# Patient Record
Sex: Male | Born: 1945 | Race: White | Hispanic: No | Smoking: Never smoker
Health system: Southern US, Community
[De-identification: ages and names within clinical notes are randomized; demographics above are authoritative.]

## PROBLEM LIST (undated history)

## (undated) DIAGNOSIS — E785 Hyperlipidemia, unspecified: Secondary | ICD-10-CM

## (undated) DIAGNOSIS — I10 Essential (primary) hypertension: Secondary | ICD-10-CM

## (undated) DIAGNOSIS — N529 Male erectile dysfunction, unspecified: Secondary | ICD-10-CM

## (undated) DIAGNOSIS — I480 Paroxysmal atrial fibrillation: Secondary | ICD-10-CM

## (undated) DIAGNOSIS — N183 Chronic kidney disease, stage 3 unspecified: Secondary | ICD-10-CM

## (undated) DIAGNOSIS — I4821 Permanent atrial fibrillation: Secondary | ICD-10-CM

## (undated) DIAGNOSIS — E7800 Pure hypercholesterolemia, unspecified: Secondary | ICD-10-CM

## (undated) DIAGNOSIS — I499 Cardiac arrhythmia, unspecified: Secondary | ICD-10-CM

## (undated) DIAGNOSIS — L570 Actinic keratosis: Secondary | ICD-10-CM

## (undated) DIAGNOSIS — M1990 Unspecified osteoarthritis, unspecified site: Secondary | ICD-10-CM

## (undated) DIAGNOSIS — N189 Chronic kidney disease, unspecified: Secondary | ICD-10-CM

## (undated) HISTORY — PX: CARDIAC CATHETERIZATION: SHX172

## (undated) HISTORY — DX: Permanent atrial fibrillation: I48.21

## (undated) HISTORY — PX: KNEE ARTHROSCOPY: SUR90

## (undated) HISTORY — PX: MENISCUS REPAIR: SHX5179

## (undated) HISTORY — DX: Actinic keratosis: L57.0

## (undated) HISTORY — DX: Paroxysmal atrial fibrillation: I48.0

---

## 2004-11-11 ENCOUNTER — Ambulatory Visit: Payer: Self-pay | Admitting: Unknown Physician Specialty

## 2005-04-02 ENCOUNTER — Other Ambulatory Visit: Payer: Self-pay

## 2005-04-02 ENCOUNTER — Emergency Department: Payer: Self-pay | Admitting: Emergency Medicine

## 2005-05-20 ENCOUNTER — Ambulatory Visit: Payer: Self-pay | Admitting: Cardiology

## 2005-09-28 ENCOUNTER — Emergency Department: Payer: Self-pay | Admitting: Internal Medicine

## 2005-10-01 ENCOUNTER — Emergency Department: Payer: Self-pay | Admitting: Internal Medicine

## 2005-10-05 ENCOUNTER — Emergency Department: Payer: Self-pay | Admitting: Emergency Medicine

## 2005-10-12 ENCOUNTER — Emergency Department: Payer: Self-pay | Admitting: Unknown Physician Specialty

## 2005-10-26 ENCOUNTER — Emergency Department: Payer: Self-pay | Admitting: Emergency Medicine

## 2006-10-04 ENCOUNTER — Ambulatory Visit: Payer: Self-pay | Admitting: Cardiology

## 2007-10-09 ENCOUNTER — Ambulatory Visit: Payer: Self-pay | Admitting: Cardiology

## 2007-10-18 ENCOUNTER — Encounter: Payer: Self-pay | Admitting: Cardiology

## 2007-10-18 ENCOUNTER — Ambulatory Visit: Payer: Self-pay

## 2010-08-11 NOTE — Assessment & Plan Note (Signed)
[facility]                            CARDIOLOGY OFFICE NOTE   CORRION, STIREWALT                      MRN:          [id number]  DATE:10/04/2006                            DOB:          01-Nov-1945    Mr. Joseph Roberson returns today for further management of paroxysmal atrial  fibrillation.   He continues to see Dr. Fidela Juneau on a regular basis.   He still is remained extremely active.  He continues with aspirin 325 a  day.   He had 1 episode of atrial fib this past weekend.  He was hot and  exhausted and dehydrated.  He took diltiazem 60 mg p.r.n. as we  suggested.  This resolved within about 6 hours.   He feels well otherwise.   He did have a normal echocardiogram in 2006 with Dr. Randa Lynn.   His blood pressure today is 140/80, his pulse is 50 and regular.  His  weight is 244.  His EKG shows sinus brady, nonspecific changes, nothing  new since his last EKG.  HEENT:  Normocephalic, atraumatic.  PERRLA.  Extraocular movements  intact.  Sclerae clear.  Facial asymmetry is normal.  Carotids are full.  No JVD.  Thyroid is not enlarged.  LUNGS:  Clear.  HEART:  A slow rate and rhythm, no gallop.  ABDOMEN:  Protuberant.  Good bowel sounds.  EXTREMITIES:  No edema.  Pulses are intact.  NEURO:  Intact.   ASSESSMENT AND PLAN:  Mr. Dudenhoeffer is doing well.  I have asked him to  continue his current meds.  I renewed his diltiazem p.r.n. I will see  him back in a year.     Thomas C. Daleen Squibb, MD, [facility]  Electronically Signed    TCW/MedQ  DD: 10/04/2006  DT: 10/05/2006  Job #: [id number]   cc:   Mardelle Matte, M.D. Randa Lynn

## 2010-08-11 NOTE — Assessment & Plan Note (Signed)
[facility]                            CARDIOLOGY OFFICE NOTE   KHYLE, GOODELL                      MRN:          [id number]  DATE:10/09/2007                            DOB:          Mar 04, 1946    HISTORY OF PRESENT ILLNESS:  Mr. Joseph Roberson returns today for further  management of his lone atrial fibrillation.   He has had to use diltiazem 60 mg p.r.n. only 3 times since last year.   His blood pressure is up and down; it is always up in the office.  He  has not had a 2-D echocardiogram in some time to assess for possible  LVH.   MEDICATIONS:  His only medicine daily is aspirin 325 mg a day.   REVIEW OF SYSTEMS:  He denies orthopnea, PND, angina, or ischemic  symptoms.   PHYSICAL EXAMINATION:  VITAL SIGNS:  His blood pressure today is 153/87,  his weight has come down 7 pounds to 237, and his heart rate is 56 and  regular.  His electrocardiogram shows sinus brady, no changes.  HEENT:  Unchanged.  NECK:  Carotid upstrokes are equal bilaterally without bruits.  No JVD.  Thyroid is not enlarged.  Trachea is midline.  LUNGS:  Clear.  HEART:  Reveals a regular rate and rhythm.  Poorly appreciated PMI.  No  murmur.  ABDOMEN:  Protuberant with good bowel sounds.  EXTREMITIES:  No cyanosis, clubbing, or edema.  Pulses are intact.  NEURO:  Exam is intact.   ASSESSMENT AND PLAN:  I have had a nice chat with Joseph Roberson.  We have  renewed his diltiazem, and we will obtain a 2-D echocardiogram to assess  for left ventricular hypertrophy.  If It does not show left ventricular  hypertrophy, we will continue with his current treatment.  If it does,  he needs to be on antihypertensive medication.  We discussed this at  length.   Otherwise, I will see him back in a year.     Thomas C. Daleen Squibb, MD, [facility]  Electronically Signed    TCW/MedQ  DD: 10/09/2007  DT: 10/09/2007  Job #: [id number]   cc:   Reola Mosher. Randa Lynn, MD

## 2011-11-17 ENCOUNTER — Emergency Department: Payer: Self-pay

## 2012-07-06 ENCOUNTER — Emergency Department: Payer: Self-pay | Admitting: Internal Medicine

## 2012-07-15 ENCOUNTER — Emergency Department: Payer: Self-pay | Admitting: Internal Medicine

## 2013-08-02 DIAGNOSIS — M1711 Unilateral primary osteoarthritis, right knee: Secondary | ICD-10-CM | POA: Insufficient documentation

## 2013-10-23 DIAGNOSIS — M720 Palmar fascial fibromatosis [Dupuytren]: Secondary | ICD-10-CM | POA: Insufficient documentation

## 2013-10-23 DIAGNOSIS — S5400XA Injury of ulnar nerve at forearm level, unspecified arm, initial encounter: Secondary | ICD-10-CM | POA: Insufficient documentation

## 2014-04-02 DIAGNOSIS — D1621 Benign neoplasm of long bones of right lower limb: Secondary | ICD-10-CM | POA: Diagnosis not present

## 2014-04-12 DIAGNOSIS — M1711 Unilateral primary osteoarthritis, right knee: Secondary | ICD-10-CM | POA: Diagnosis not present

## 2014-04-12 DIAGNOSIS — S83241A Other tear of medial meniscus, current injury, right knee, initial encounter: Secondary | ICD-10-CM | POA: Diagnosis not present

## 2014-04-12 DIAGNOSIS — S83521A Sprain of posterior cruciate ligament of right knee, initial encounter: Secondary | ICD-10-CM | POA: Diagnosis not present

## 2014-04-15 DIAGNOSIS — E785 Hyperlipidemia, unspecified: Secondary | ICD-10-CM | POA: Diagnosis not present

## 2014-04-15 DIAGNOSIS — N183 Chronic kidney disease, stage 3 (moderate): Secondary | ICD-10-CM | POA: Diagnosis not present

## 2014-04-22 DIAGNOSIS — I480 Paroxysmal atrial fibrillation: Secondary | ICD-10-CM | POA: Diagnosis not present

## 2014-04-22 DIAGNOSIS — Z79899 Other long term (current) drug therapy: Secondary | ICD-10-CM | POA: Diagnosis not present

## 2014-04-22 DIAGNOSIS — E7800 Pure hypercholesterolemia, unspecified: Secondary | ICD-10-CM | POA: Insufficient documentation

## 2014-04-22 DIAGNOSIS — N529 Male erectile dysfunction, unspecified: Secondary | ICD-10-CM | POA: Diagnosis not present

## 2014-04-26 DIAGNOSIS — S83231A Complex tear of medial meniscus, current injury, right knee, initial encounter: Secondary | ICD-10-CM | POA: Diagnosis not present

## 2014-04-26 DIAGNOSIS — Y9364 Activity, baseball: Secondary | ICD-10-CM | POA: Diagnosis not present

## 2014-04-26 DIAGNOSIS — S83521A Sprain of posterior cruciate ligament of right knee, initial encounter: Secondary | ICD-10-CM | POA: Diagnosis not present

## 2014-04-26 DIAGNOSIS — M23321 Other meniscus derangements, posterior horn of medial meniscus, right knee: Secondary | ICD-10-CM | POA: Diagnosis not present

## 2014-04-26 DIAGNOSIS — M94261 Chondromalacia, right knee: Secondary | ICD-10-CM | POA: Diagnosis not present

## 2014-04-26 DIAGNOSIS — M179 Osteoarthritis of knee, unspecified: Secondary | ICD-10-CM | POA: Diagnosis not present

## 2014-04-26 DIAGNOSIS — Y92320 Baseball field as the place of occurrence of the external cause: Secondary | ICD-10-CM | POA: Diagnosis not present
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
## 2014-05-14 DIAGNOSIS — M25561 Pain in right knee: Secondary | ICD-10-CM | POA: Diagnosis not present

## 2014-05-23 DIAGNOSIS — M25561 Pain in right knee: Secondary | ICD-10-CM | POA: Diagnosis not present

## 2014-06-03 DIAGNOSIS — Z4789 Encounter for other orthopedic aftercare: Secondary | ICD-10-CM | POA: Diagnosis not present

## 2014-06-03 DIAGNOSIS — S82241D Displaced spiral fracture of shaft of right tibia, subsequent encounter for closed fracture with routine healing: Secondary | ICD-10-CM | POA: Diagnosis not present

## 2014-06-03 DIAGNOSIS — M1711 Unilateral primary osteoarthritis, right knee: Secondary | ICD-10-CM | POA: Diagnosis not present

## 2014-09-16 DIAGNOSIS — S8012XA Contusion of left lower leg, initial encounter: Secondary | ICD-10-CM | POA: Diagnosis not present

## 2014-10-21 DIAGNOSIS — N183 Chronic kidney disease, stage 3 (moderate): Secondary | ICD-10-CM | POA: Diagnosis not present

## 2014-10-21 DIAGNOSIS — E780 Pure hypercholesterolemia: Secondary | ICD-10-CM | POA: Diagnosis not present

## 2014-10-21 DIAGNOSIS — Z0000 Encounter for general adult medical examination without abnormal findings: Secondary | ICD-10-CM | POA: Diagnosis not present

## 2014-10-22 DIAGNOSIS — Z125 Encounter for screening for malignant neoplasm of prostate: Secondary | ICD-10-CM | POA: Diagnosis not present

## 2014-10-22 DIAGNOSIS — Z131 Encounter for screening for diabetes mellitus: Secondary | ICD-10-CM | POA: Diagnosis not present

## 2014-10-22 DIAGNOSIS — E780 Pure hypercholesterolemia: Secondary | ICD-10-CM | POA: Diagnosis not present

## 2015-01-06 DIAGNOSIS — Z23 Encounter for immunization: Secondary | ICD-10-CM | POA: Diagnosis not present

## 2015-01-06 DIAGNOSIS — J0100 Acute maxillary sinusitis, unspecified: Secondary | ICD-10-CM | POA: Diagnosis not present

## 2015-03-06 ENCOUNTER — Encounter: Payer: Self-pay | Admitting: *Deleted

## 2015-03-07 ENCOUNTER — Ambulatory Visit: Payer: Commercial Managed Care - HMO | Admitting: Anesthesiology

## 2015-03-07 ENCOUNTER — Encounter
Admission: RE | Disposition: A | Discharge status: Home / Self Care | Payer: Self-pay | Source: Ambulatory Visit | Attending: Unknown Physician Specialty

## 2015-03-07 ENCOUNTER — Ambulatory Visit
Admission: RE | Admit: 2015-03-07 | Discharge: 2015-03-07 | Disposition: A | Discharge status: Home / Self Care | Payer: Commercial Managed Care - HMO | Source: Ambulatory Visit | Attending: Unknown Physician Specialty | Admitting: Unknown Physician Specialty

## 2015-03-07 ENCOUNTER — Encounter: Payer: Self-pay | Admitting: *Deleted

## 2015-03-07 DIAGNOSIS — D122 Benign neoplasm of ascending colon: Secondary | ICD-10-CM | POA: Insufficient documentation

## 2015-03-07 DIAGNOSIS — N189 Chronic kidney disease, unspecified: Secondary | ICD-10-CM | POA: Insufficient documentation

## 2015-03-07 DIAGNOSIS — Z1211 Encounter for screening for malignant neoplasm of colon: Secondary | ICD-10-CM | POA: Diagnosis not present

## 2015-03-07 DIAGNOSIS — K5790 Diverticulosis of intestine, part unspecified, without perforation or abscess without bleeding: Secondary | ICD-10-CM | POA: Diagnosis not present

## 2015-03-07 DIAGNOSIS — Z7982 Long term (current) use of aspirin: Secondary | ICD-10-CM | POA: Diagnosis not present

## 2015-03-07 DIAGNOSIS — K635 Polyp of colon: Secondary | ICD-10-CM | POA: Diagnosis not present

## 2015-03-07 DIAGNOSIS — I499 Cardiac arrhythmia, unspecified: Secondary | ICD-10-CM | POA: Insufficient documentation

## 2015-03-07 DIAGNOSIS — K648 Other hemorrhoids: Secondary | ICD-10-CM | POA: Diagnosis not present

## 2015-03-07 DIAGNOSIS — E785 Hyperlipidemia, unspecified: Secondary | ICD-10-CM | POA: Insufficient documentation

## 2015-03-07 DIAGNOSIS — M1990 Unspecified osteoarthritis, unspecified site: Secondary | ICD-10-CM | POA: Insufficient documentation

## 2015-03-07 DIAGNOSIS — I129 Hypertensive chronic kidney disease with stage 1 through stage 4 chronic kidney disease, or unspecified chronic kidney disease: Secondary | ICD-10-CM | POA: Insufficient documentation

## 2015-03-07 DIAGNOSIS — K640 First degree hemorrhoids: Secondary | ICD-10-CM | POA: Insufficient documentation

## 2015-03-07 DIAGNOSIS — K5730 Diverticulosis of large intestine without perforation or abscess without bleeding: Secondary | ICD-10-CM | POA: Diagnosis not present

## 2015-03-07 HISTORY — DX: Essential (primary) hypertension: I10

## 2015-03-07 HISTORY — DX: Cardiac arrhythmia, unspecified: I49.9

## 2015-03-07 HISTORY — DX: Pure hypercholesterolemia, unspecified: E78.00

## 2015-03-07 HISTORY — DX: Chronic kidney disease, unspecified: N18.9

## 2015-03-07 HISTORY — PX: COLONOSCOPY WITH PROPOFOL: SHX5780

## 2015-03-07 HISTORY — DX: Unspecified osteoarthritis, unspecified site: M19.90

## 2015-03-07 HISTORY — DX: Male erectile dysfunction, unspecified: N52.9

## 2015-03-07 HISTORY — DX: Hyperlipidemia, unspecified: E78.5

## 2015-03-07 SURGERY — COLONOSCOPY WITH PROPOFOL
Anesthesia: General

## 2015-03-07 MED ORDER — PROPOFOL 10 MG/ML IV BOLUS
INTRAVENOUS | Status: DC | PRN
  Administered 2015-03-07: 50 mg via INTRAVENOUS

## 2015-03-07 MED ORDER — SODIUM CHLORIDE 0.9 % IV SOLN
INTRAVENOUS | Status: DC
  Administered 2015-03-07: 13:00:00 via INTRAVENOUS

## 2015-03-07 MED ORDER — FENTANYL CITRATE (PF) 100 MCG/2ML IJ SOLN
INTRAMUSCULAR | Status: DC | PRN
  Administered 2015-03-07: 50 ug via INTRAVENOUS

## 2015-03-07 MED ORDER — MIDAZOLAM HCL 5 MG/5ML IJ SOLN
INTRAMUSCULAR | Status: DC | PRN
  Administered 2015-03-07: 1 mg via INTRAVENOUS

## 2015-03-07 MED ORDER — PROPOFOL 500 MG/50ML IV EMUL
INTRAVENOUS | Status: DC | PRN
  Administered 2015-03-07: 140 ug/kg/min via INTRAVENOUS

## 2015-03-07 MED ORDER — SODIUM CHLORIDE 0.9 % IV SOLN: INTRAVENOUS | Status: DC

## 2015-03-07 NOTE — Anesthesia Preprocedure Evaluation (Signed)
Anesthesia Evaluation  Patient identified by MRN, date of birth, ID band Patient awake    Reviewed: Allergy & Precautions, H&P , NPO status , Patient's Chart, lab work & pertinent test results  History of Anesthesia Complications Negative for: history of anesthetic complications  Airway Mallampati: III  TM Distance: <3 FB Neck ROM: limited    Dental no notable dental hx. (+) Teeth Intact   Pulmonary neg pulmonary ROS, neg shortness of breath,    Pulmonary exam normal breath sounds clear to auscultation       Cardiovascular Exercise Tolerance: Good hypertension, (-) angina(-) Past MI and (-) DOE Normal cardiovascular exam+ dysrhythmias Atrial Fibrillation  Rhythm:regular Rate:Normal     Neuro/Psych negative neurological ROS  negative psych ROS   GI/Hepatic negative GI ROS, Neg liver ROS,   Endo/Other  negative endocrine ROS  Renal/GU Renal disease  negative genitourinary   Musculoskeletal  (+) Arthritis ,   Abdominal   Peds  Hematology negative hematology ROS (+)   Anesthesia Other Findings Past Medical History:   Chronic kidney disease                                       Hypercholesteremia                                           Erectile dysfunction                                         Arthritis                                                    Dysrhythmia                                                  Hypertension                                                 Hyperlipidemia                                              Past Surgical History:   KNEE ARTHROSCOPY                                              MENISCUS REPAIR  BMI    Body Mass Index   28.87 kg/m 2      Reproductive/Obstetrics negative OB ROS                             Anesthesia Physical Anesthesia Plan  ASA: III  Anesthesia Plan: General   Post-op Pain  Management:    Induction:   Airway Management Planned:   Additional Equipment:   Intra-op Plan:   Post-operative Plan:   Informed Consent: I have reviewed the patients History and Physical, chart, labs and discussed the procedure including the risks, benefits and alternatives for the proposed anesthesia with the patient or authorized representative who has indicated his/her understanding and acceptance.   Dental Advisory Given  Plan Discussed with: Anesthesiologist, CRNA and Surgeon  Anesthesia Plan Comments:         Anesthesia Quick Evaluation

## 2015-03-07 NOTE — Transfer of Care (Signed)
Immediate Anesthesia Transfer of Care Note  Patient: Joseph Roberson  Procedure(s) Performed: Procedure(s): COLONOSCOPY WITH PROPOFOL (N/A)  Patient Location: PACU and Endoscopy Unit  Anesthesia Type:General  Level of Consciousness: awake, alert  and oriented  Airway & Oxygen Therapy: Patient Spontanous Breathing and Patient connected to nasal cannula oxygen  Post-op Assessment: Report given to RN and Post -op Vital signs reviewed and stable  Post vital signs: Reviewed and stable  Last Vitals:  Filed Vitals:   03/07/15 1350 03/07/15 1354  BP: 140/90   Pulse: 84   Temp: 36.3 C 36.3 C  Resp: 18     Complications: No apparent anesthesia complications

## 2015-03-07 NOTE — Anesthesia Postprocedure Evaluation (Signed)
Anesthesia Post Note  Patient: Joseph Roberson  Procedure(s) Performed: Procedure(s) (LRB): COLONOSCOPY WITH PROPOFOL (N/A)  Patient location during evaluation: Endoscopy Anesthesia Type: General Level of consciousness: awake and alert Pain management: pain level controlled Vital Signs Assessment: post-procedure vital signs reviewed and stable Respiratory status: spontaneous breathing, nonlabored ventilation, respiratory function stable and patient connected to nasal cannula oxygen Cardiovascular status: blood pressure returned to baseline and stable Postop Assessment: no signs of nausea or vomiting Anesthetic complications: no    Last Vitals:  Filed Vitals:   03/07/15 1410 03/07/15 1420  BP: 149/104 136/102  Pulse: 66 122  Temp:    Resp: 18 15    Last Pain: There were no vitals filed for this visit.               Precious Haws Isom Kochan

## 2015-03-07 NOTE — H&P (Signed)
   Primary Care Physician:  Marcello Fennel, MD Primary Gastroenterologist:  Dr. Vira Agar  Pre-Procedure History & Physical: HPI:  Joseph Roberson is a 69 y.o. male is here for an colonoscopy.   Past Medical History  Diagnosis Date  . Chronic kidney disease   . Hypercholesteremia   . Erectile dysfunction   . Arthritis   . Dysrhythmia   . Hypertension   . Hyperlipidemia     Past Surgical History  Procedure Laterality Date  . Knee arthroscopy    . Meniscus repair      Prior to Admission medications   Medication Sig Start Date End Date Taking? Authorizing Provider  aspirin (ASPIRIN EC) 81 MG EC tablet Take 81 mg by mouth daily. Swallow whole.   Yes Historical Provider, MD    Allergies as of 12/23/2014  . (Not on File)    History reviewed. No pertinent family history.  Social History   Social History  . Marital Status: Divorced    Spouse Name: N/A  . Number of Children: N/A  . Years of Education: N/A   Occupational History  . Not on file.   Social History Main Topics  . Smoking status: Never Smoker   . Smokeless tobacco: Never Used  . Alcohol Use: 0.6 oz/week    1 Cans of beer per week  . Drug Use: No  . Sexual Activity: Not on file   Other Topics Concern  . Not on file   Social History Narrative    Review of Systems: See HPI, otherwise negative ROS  Physical Exam: BP 173/112 mmHg  Pulse 81  Temp(Src) 97.6 F (36.4 C) (Tympanic)  Resp 18  Ht 6\' 2"  (1.88 m)  Wt 102.059 kg (225 lb)  BMI 28.88 kg/m2  SpO2 98% General:   Alert,  pleasant and cooperative in NAD Head:  Normocephalic and atraumatic. Neck:  Supple; no masses or thyromegaly. Lungs:  Clear throughout to auscultation.    Heart:  Regular rate and rhythm. Abdomen:  Soft, nontender and nondistended. Normal bowel sounds, without guarding, and without rebound.   Neurologic:  Alert and  oriented x4;  grossly normal neurologically.  Impression/Plan: Joseph Roberson is here for an  colonoscopy to be performed for screening  Risks, benefits, limitations, and alternatives regarding  colonoscopy have been reviewed with the patient.  Questions have been answered.  All parties agreeable.   Gaylyn Cheers, MD  03/07/2015, 1:21 PM

## 2015-03-07 NOTE — Anesthesia Procedure Notes (Signed)
Date/Time: 03/07/2015 1:23 PM Performed by: Kennon Holter Pre-anesthesia Checklist: Timeout performed, Patient being monitored, Suction available, Emergency Drugs available and Patient identified Patient Re-evaluated:Patient Re-evaluated prior to inductionOxygen Delivery Method: Nasal cannula Preoxygenation: Pre-oxygenation with 100% oxygen Intubation Type: IV induction

## 2015-03-07 NOTE — Op Note (Signed)
[facility] Patient Name: Joseph Roberson Procedure Date: 03/07/2015 12:59 PM MRN: [id number] Account #: [id number] Date of Birth: Nov 27, 1945 Admit Type: Outpatient Age: 69 Room: [facility] ENDO ROOM 1 Gender: Male Note Status: Finalized Procedure:         Colonoscopy Indications:       Screening for colorectal malignant neoplasm Providers:         Manya Silvas, MD Referring MD:      Caprice Renshaw (Referring MD) Medicines:         Propofol per Anesthesia Complications:     No immediate complications. Procedure:         Pre-Anesthesia Assessment:                    - After reviewing the risks and benefits, the patient was                     deemed in satisfactory condition to undergo the procedure.                    After obtaining informed consent, the colonoscope was                     passed under direct vision. Throughout the procedure, the                     patient's blood pressure, pulse, and oxygen saturations                     were monitored continuously. The Colonoscope was                     introduced through the anus and advanced to the the cecum,                     identified by appendiceal orifice and ileocecal valve. The                     colonoscopy was performed without difficulty. The patient                     tolerated the procedure well. The quality of the bowel                     preparation was good. Findings:      A diminutive polyp was found in the ascending colon. The polyp was       sessile. The polyp was removed with a jumbo cold forceps. Resection and       retrieval were complete.      Multiple small-mouthed diverticula were found in the sigmoid colon, in       the descending colon and in the transverse colon.      Internal hemorrhoids were found during endoscopy. The hemorrhoids were       small and Grade I (internal hemorrhoids that do not prolapse).      The exam was otherwise without  abnormality. Impression:        - One diminutive polyp in the ascending colon. Resected                     and retrieved.                    - Diverticulosis in the sigmoid colon, in the descending  colon and in the transverse colon.                    - Internal hemorrhoids.                    - The examination was otherwise normal. Recommendation:    - Await pathology results. Manya Silvas, MD 03/07/2015 1:49:39 PM This report has been signed electronically. Number of Addenda: 0 Note Initiated On: 03/07/2015 12:59 PM Scope Withdrawal Time: 0 hours 12 minutes 50 seconds  Total Procedure Duration: 0 hours 21 minutes 55 seconds       [facility]

## 2015-03-08 ENCOUNTER — Encounter: Payer: Self-pay | Admitting: Unknown Physician Specialty

## 2015-03-11 LAB — SURGICAL PATHOLOGY

## 2015-04-14 DIAGNOSIS — N183 Chronic kidney disease, stage 3 (moderate): Secondary | ICD-10-CM | POA: Diagnosis not present

## 2015-04-14 DIAGNOSIS — E7800 Pure hypercholesterolemia, unspecified: Secondary | ICD-10-CM | POA: Diagnosis not present

## 2015-04-21 DIAGNOSIS — I480 Paroxysmal atrial fibrillation: Secondary | ICD-10-CM | POA: Diagnosis not present

## 2015-04-21 DIAGNOSIS — N183 Chronic kidney disease, stage 3 (moderate): Secondary | ICD-10-CM | POA: Diagnosis not present

## 2015-04-21 DIAGNOSIS — R0789 Other chest pain: Secondary | ICD-10-CM | POA: Diagnosis not present

## 2015-04-21 DIAGNOSIS — E7800 Pure hypercholesterolemia, unspecified: Secondary | ICD-10-CM | POA: Diagnosis not present

## 2015-04-21 DIAGNOSIS — I10 Essential (primary) hypertension: Secondary | ICD-10-CM | POA: Diagnosis not present

## 2015-04-22 DIAGNOSIS — R0789 Other chest pain: Secondary | ICD-10-CM | POA: Diagnosis not present

## 2015-05-01 DIAGNOSIS — I10 Essential (primary) hypertension: Secondary | ICD-10-CM | POA: Diagnosis not present

## 2015-05-01 DIAGNOSIS — Z125 Encounter for screening for malignant neoplasm of prostate: Secondary | ICD-10-CM | POA: Diagnosis not present

## 2015-05-01 DIAGNOSIS — Z79899 Other long term (current) drug therapy: Secondary | ICD-10-CM | POA: Diagnosis not present

## 2015-05-01 DIAGNOSIS — I4891 Unspecified atrial fibrillation: Secondary | ICD-10-CM | POA: Diagnosis not present

## 2015-05-15 DIAGNOSIS — H2510 Age-related nuclear cataract, unspecified eye: Secondary | ICD-10-CM | POA: Diagnosis not present

## 2015-05-15 DIAGNOSIS — I10 Essential (primary) hypertension: Secondary | ICD-10-CM | POA: Diagnosis not present

## 2015-05-15 DIAGNOSIS — H40039 Anatomical narrow angle, unspecified eye: Secondary | ICD-10-CM | POA: Diagnosis not present

## 2015-05-15 DIAGNOSIS — Z0100 Encounter for examination of eyes and vision without abnormal findings: Secondary | ICD-10-CM | POA: Diagnosis not present

## 2015-10-16 DIAGNOSIS — Z125 Encounter for screening for malignant neoplasm of prostate: Secondary | ICD-10-CM | POA: Diagnosis not present

## 2015-10-16 DIAGNOSIS — Z79899 Other long term (current) drug therapy: Secondary | ICD-10-CM | POA: Diagnosis not present

## 2015-10-16 DIAGNOSIS — I10 Essential (primary) hypertension: Secondary | ICD-10-CM | POA: Diagnosis not present

## 2015-10-23 DIAGNOSIS — Z79899 Other long term (current) drug therapy: Secondary | ICD-10-CM | POA: Diagnosis not present

## 2015-10-23 DIAGNOSIS — Z0000 Encounter for general adult medical examination without abnormal findings: Secondary | ICD-10-CM | POA: Diagnosis not present

## 2016-03-02 DIAGNOSIS — Z23 Encounter for immunization: Secondary | ICD-10-CM | POA: Diagnosis not present

## 2016-03-02 DIAGNOSIS — R1032 Left lower quadrant pain: Secondary | ICD-10-CM | POA: Diagnosis not present

## 2016-03-11 DIAGNOSIS — R1032 Left lower quadrant pain: Secondary | ICD-10-CM | POA: Diagnosis not present

## 2016-03-24 ENCOUNTER — Telehealth: Payer: Self-pay | Admitting: Family Medicine

## 2016-03-24 NOTE — Telephone Encounter (Signed)
Patient would like to get in earlier with Dr.Smith if he can.  States he is having mobility issues.

## 2016-03-25 DIAGNOSIS — R52 Pain, unspecified: Secondary | ICD-10-CM | POA: Diagnosis not present

## 2016-03-25 NOTE — Progress Notes (Deleted)
[facility] [address] [place], [place] [address] Phone: [phone] Subjective:    I'm seeing this patient by the request  of:  BABAOFF, MARC E, MD   CC: Left hip and groin pain  [id number]  Joseph Roberson is a 70 y.o. male coming in with complaint of left hip and groin pain.     Past Medical History:  Diagnosis Date  . Arthritis   . Chronic kidney disease   . Dysrhythmia   . Erectile dysfunction   . Hypercholesteremia   . Hyperlipidemia   . Hypertension    Past Surgical History:  Procedure Laterality Date  . COLONOSCOPY WITH PROPOFOL N/A 03/07/2015   Procedure: COLONOSCOPY WITH PROPOFOL;  Surgeon: Manya Silvas, MD;  Location: [facility] ENDOSCOPY;  Service: Endoscopy;  Laterality: N/A;  . KNEE ARTHROSCOPY    . MENISCUS REPAIR     Social History   Social History  . Marital status: Divorced    Spouse name: N/A  . Number of children: N/A  . Years of education: N/A   Social History Main Topics  . Smoking status: Never Smoker  . Smokeless tobacco: Never Used  . Alcohol use 0.6 oz/week    1 Cans of beer per week  . Drug use: No  . Sexual activity: Not on file   Other Topics Concern  . Not on file   Social History Narrative  . No narrative on file   Allergies  Allergen Reactions  . Cardizem [Diltiazem Hcl]   . Crestor [Rosuvastatin]   . Vytorin [Ezetimibe-Simvastatin]   . Zocor [Simvastatin]    No family history on file.  Past medical history, social, surgical and family history all reviewed in electronic medical record.  No pertanent information unless stated regarding to the chief complaint.   Review of Systems:Review of systems updated and as accurate as of 03/25/16  No headache, visual changes, nausea, vomiting, diarrhea, constipation, dizziness, abdominal pain, skin rash, fevers, chills, night sweats, weight loss, swollen lymph nodes, body aches, joint swelling, muscle aches, chest pain, shortness of breath, mood  changes.   Objective  There were no vitals taken for this visit. Systems examined below as of 03/25/16   General: No apparent distress alert and oriented x3 mood and affect normal, dressed appropriately.  HEENT: Pupils equal, extraocular movements intact  Respiratory: Patient's speak in full sentences and does not appear short of breath  Cardiovascular: No lower extremity edema, non tender, no erythema  Skin: Warm dry intact with no signs of infection or rash on extremities or on axial skeleton.  Abdomen: Soft nontender  Neuro: Cranial nerves II through XII are intact, neurovascularly intact in all extremities with 2+ DTRs and 2+ pulses.  Lymph: No lymphadenopathy of posterior or anterior cervical chain or axillae bilaterally.  Gait normal with good balance and coordination.  MSK:  Non tender with full range of motion and good stability and symmetric strength and tone of shoulders, elbows, wrist, knee and ankles bilaterally.  Hip: ROM IR: 45 Deg, ER: 45 Deg, Flexion: 120 Deg, Extension: 100 Deg, Abduction: 45 Deg, Adduction: 45 Deg Strength IR: 5/5, ER: 5/5, Flexion: 5/5, Extension: 5/5, Abduction: 5/5, Adduction: 5/5 Pelvic alignment unremarkable to inspection and palpation. Standing hip rotation and gait without trendelenburg sign / unsteadiness. Greater trochanter without tenderness to palpation. No tenderness over piriformis and greater trochanter. No pain with FABER or FADIR. No SI joint tenderness and normal minimal SI movement.   Impression and Recommendations:  This case required medical decision making of moderate complexity.      Note: This dictation was prepared with Dragon dictation along with smaller phrase technology. Any transcriptional errors that result from this process are unintentional.

## 2016-03-25 NOTE — Telephone Encounter (Signed)
Spoke to pt, he is coming in tomorrow @ 11am.

## 2016-03-26 ENCOUNTER — Ambulatory Visit: Payer: Commercial Managed Care - HMO | Admitting: Family Medicine

## 2016-03-26 DIAGNOSIS — M353 Polymyalgia rheumatica: Secondary | ICD-10-CM | POA: Diagnosis not present

## 2016-04-06 ENCOUNTER — Ambulatory Visit: Payer: Commercial Managed Care - HMO | Admitting: Family Medicine

## 2016-04-19 DIAGNOSIS — E7800 Pure hypercholesterolemia, unspecified: Secondary | ICD-10-CM | POA: Diagnosis not present

## 2016-04-19 DIAGNOSIS — Z79899 Other long term (current) drug therapy: Secondary | ICD-10-CM | POA: Diagnosis not present

## 2016-04-19 DIAGNOSIS — M353 Polymyalgia rheumatica: Secondary | ICD-10-CM | POA: Diagnosis not present

## 2016-04-19 DIAGNOSIS — Z87448 Personal history of other diseases of urinary system: Secondary | ICD-10-CM | POA: Diagnosis not present

## 2016-04-27 DIAGNOSIS — M353 Polymyalgia rheumatica: Secondary | ICD-10-CM | POA: Diagnosis not present

## 2016-04-27 DIAGNOSIS — I10 Essential (primary) hypertension: Secondary | ICD-10-CM | POA: Diagnosis not present

## 2016-04-27 DIAGNOSIS — Z79899 Other long term (current) drug therapy: Secondary | ICD-10-CM | POA: Diagnosis not present

## 2016-06-03 DIAGNOSIS — J0100 Acute maxillary sinusitis, unspecified: Secondary | ICD-10-CM | POA: Diagnosis not present

## 2016-07-05 DIAGNOSIS — Z79899 Other long term (current) drug therapy: Secondary | ICD-10-CM | POA: Diagnosis not present

## 2016-07-05 DIAGNOSIS — I10 Essential (primary) hypertension: Secondary | ICD-10-CM | POA: Diagnosis not present

## 2016-07-12 DIAGNOSIS — Z79899 Other long term (current) drug therapy: Secondary | ICD-10-CM | POA: Diagnosis not present

## 2016-07-12 DIAGNOSIS — M353 Polymyalgia rheumatica: Secondary | ICD-10-CM | POA: Diagnosis not present

## 2016-07-12 DIAGNOSIS — N183 Chronic kidney disease, stage 3 (moderate): Secondary | ICD-10-CM | POA: Diagnosis not present

## 2016-07-12 DIAGNOSIS — R739 Hyperglycemia, unspecified: Secondary | ICD-10-CM | POA: Diagnosis not present

## 2016-07-12 DIAGNOSIS — I10 Essential (primary) hypertension: Secondary | ICD-10-CM | POA: Diagnosis not present

## 2016-07-12 DIAGNOSIS — E7800 Pure hypercholesterolemia, unspecified: Secondary | ICD-10-CM | POA: Diagnosis not present

## 2016-07-12 DIAGNOSIS — I4891 Unspecified atrial fibrillation: Secondary | ICD-10-CM | POA: Diagnosis not present

## 2016-08-26 DIAGNOSIS — R69 Illness, unspecified: Secondary | ICD-10-CM | POA: Diagnosis not present

## 2016-11-08 DIAGNOSIS — N183 Chronic kidney disease, stage 3 (moderate): Secondary | ICD-10-CM | POA: Diagnosis not present

## 2016-11-08 DIAGNOSIS — Z79899 Other long term (current) drug therapy: Secondary | ICD-10-CM | POA: Diagnosis not present

## 2016-11-08 DIAGNOSIS — R739 Hyperglycemia, unspecified: Secondary | ICD-10-CM | POA: Diagnosis not present

## 2016-11-08 DIAGNOSIS — E7800 Pure hypercholesterolemia, unspecified: Secondary | ICD-10-CM | POA: Diagnosis not present

## 2016-11-08 DIAGNOSIS — M353 Polymyalgia rheumatica: Secondary | ICD-10-CM | POA: Diagnosis not present

## 2016-11-15 DIAGNOSIS — Z0000 Encounter for general adult medical examination without abnormal findings: Secondary | ICD-10-CM | POA: Diagnosis not present

## 2017-01-26 DIAGNOSIS — H40039 Anatomical narrow angle, unspecified eye: Secondary | ICD-10-CM | POA: Diagnosis not present

## 2017-01-26 DIAGNOSIS — Z0100 Encounter for examination of eyes and vision without abnormal findings: Secondary | ICD-10-CM | POA: Diagnosis not present

## 2017-05-12 DIAGNOSIS — N183 Chronic kidney disease, stage 3 (moderate): Secondary | ICD-10-CM | POA: Diagnosis not present

## 2017-05-12 DIAGNOSIS — E7800 Pure hypercholesterolemia, unspecified: Secondary | ICD-10-CM | POA: Diagnosis not present

## 2017-05-12 DIAGNOSIS — Z79899 Other long term (current) drug therapy: Secondary | ICD-10-CM | POA: Diagnosis not present

## 2017-05-12 DIAGNOSIS — Z125 Encounter for screening for malignant neoplasm of prostate: Secondary | ICD-10-CM | POA: Diagnosis not present

## 2017-05-12 DIAGNOSIS — R739 Hyperglycemia, unspecified: Secondary | ICD-10-CM | POA: Diagnosis not present

## 2017-05-19 DIAGNOSIS — M353 Polymyalgia rheumatica: Secondary | ICD-10-CM | POA: Diagnosis not present

## 2017-05-19 DIAGNOSIS — R739 Hyperglycemia, unspecified: Secondary | ICD-10-CM | POA: Diagnosis not present

## 2017-05-19 DIAGNOSIS — Z79899 Other long term (current) drug therapy: Secondary | ICD-10-CM | POA: Diagnosis not present

## 2017-05-19 DIAGNOSIS — I4891 Unspecified atrial fibrillation: Secondary | ICD-10-CM | POA: Diagnosis not present

## 2017-05-19 DIAGNOSIS — E7800 Pure hypercholesterolemia, unspecified: Secondary | ICD-10-CM | POA: Diagnosis not present

## 2017-05-19 DIAGNOSIS — I10 Essential (primary) hypertension: Secondary | ICD-10-CM | POA: Diagnosis not present

## 2017-06-16 DIAGNOSIS — M353 Polymyalgia rheumatica: Secondary | ICD-10-CM | POA: Diagnosis not present

## 2017-06-16 DIAGNOSIS — M1990 Unspecified osteoarthritis, unspecified site: Secondary | ICD-10-CM | POA: Diagnosis not present

## 2017-07-04 DIAGNOSIS — M1990 Unspecified osteoarthritis, unspecified site: Secondary | ICD-10-CM | POA: Diagnosis not present

## 2017-07-04 DIAGNOSIS — M353 Polymyalgia rheumatica: Secondary | ICD-10-CM | POA: Diagnosis not present

## 2017-07-12 DIAGNOSIS — M353 Polymyalgia rheumatica: Secondary | ICD-10-CM | POA: Diagnosis not present

## 2017-08-02 DIAGNOSIS — S8011XA Contusion of right lower leg, initial encounter: Secondary | ICD-10-CM | POA: Diagnosis not present

## 2017-08-11 DIAGNOSIS — M353 Polymyalgia rheumatica: Secondary | ICD-10-CM | POA: Diagnosis not present

## 2017-09-19 DIAGNOSIS — M353 Polymyalgia rheumatica: Secondary | ICD-10-CM | POA: Diagnosis not present

## 2017-10-11 DIAGNOSIS — M722 Plantar fascial fibromatosis: Secondary | ICD-10-CM | POA: Diagnosis not present

## 2017-10-18 DIAGNOSIS — M79672 Pain in left foot: Secondary | ICD-10-CM | POA: Diagnosis not present

## 2017-10-20 DIAGNOSIS — R739 Hyperglycemia, unspecified: Secondary | ICD-10-CM | POA: Diagnosis not present

## 2017-10-20 DIAGNOSIS — Z79899 Other long term (current) drug therapy: Secondary | ICD-10-CM | POA: Diagnosis not present

## 2017-10-20 DIAGNOSIS — M353 Polymyalgia rheumatica: Secondary | ICD-10-CM | POA: Diagnosis not present

## 2017-10-20 DIAGNOSIS — E7800 Pure hypercholesterolemia, unspecified: Secondary | ICD-10-CM | POA: Diagnosis not present

## 2017-11-07 DIAGNOSIS — M79672 Pain in left foot: Secondary | ICD-10-CM | POA: Diagnosis not present

## 2017-11-07 DIAGNOSIS — S93312A Subluxation of tarsal joint of left foot, initial encounter: Secondary | ICD-10-CM | POA: Diagnosis not present

## 2017-11-17 DIAGNOSIS — Z79899 Other long term (current) drug therapy: Secondary | ICD-10-CM | POA: Diagnosis not present

## 2017-11-17 DIAGNOSIS — Z0000 Encounter for general adult medical examination without abnormal findings: Secondary | ICD-10-CM | POA: Diagnosis not present

## 2017-11-17 DIAGNOSIS — I10 Essential (primary) hypertension: Secondary | ICD-10-CM | POA: Diagnosis not present

## 2017-11-17 DIAGNOSIS — E7800 Pure hypercholesterolemia, unspecified: Secondary | ICD-10-CM | POA: Diagnosis not present

## 2017-11-17 DIAGNOSIS — R739 Hyperglycemia, unspecified: Secondary | ICD-10-CM | POA: Diagnosis not present

## 2017-11-17 DIAGNOSIS — Z125 Encounter for screening for malignant neoplasm of prostate: Secondary | ICD-10-CM | POA: Diagnosis not present

## 2017-12-12 DIAGNOSIS — M10072 Idiopathic gout, left ankle and foot: Secondary | ICD-10-CM | POA: Diagnosis not present

## 2017-12-12 DIAGNOSIS — S93312A Subluxation of tarsal joint of left foot, initial encounter: Secondary | ICD-10-CM | POA: Diagnosis not present

## 2017-12-13 DIAGNOSIS — M353 Polymyalgia rheumatica: Secondary | ICD-10-CM | POA: Diagnosis not present

## 2018-01-06 DIAGNOSIS — M353 Polymyalgia rheumatica: Secondary | ICD-10-CM | POA: Diagnosis not present

## 2018-01-17 DIAGNOSIS — M353 Polymyalgia rheumatica: Secondary | ICD-10-CM | POA: Diagnosis not present

## 2018-01-17 DIAGNOSIS — M1A00X0 Idiopathic chronic gout, unspecified site, without tophus (tophi): Secondary | ICD-10-CM | POA: Diagnosis not present

## 2018-01-24 DIAGNOSIS — I10 Essential (primary) hypertension: Secondary | ICD-10-CM | POA: Diagnosis not present

## 2018-01-24 DIAGNOSIS — Z0100 Encounter for examination of eyes and vision without abnormal findings: Secondary | ICD-10-CM | POA: Diagnosis not present

## 2019-09-24 ENCOUNTER — Encounter: Payer: Self-pay | Admitting: Emergency Medicine

## 2019-09-24 ENCOUNTER — Encounter
Admission: EM | Disposition: A | Discharge status: Home / Self Care | Payer: Self-pay | Source: Home / Self Care | Attending: Student in an Organized Health Care Education/Training Program

## 2019-09-24 ENCOUNTER — Emergency Department: Payer: Medicare HMO

## 2019-09-24 ENCOUNTER — Observation Stay (HOSPITAL_BASED_OUTPATIENT_CLINIC_OR_DEPARTMENT_OTHER)
Admit: 2019-09-24 | Discharge: 2019-09-24 | Disposition: A | Discharge status: Home / Self Care | Payer: Medicare HMO | Attending: Cardiovascular Disease | Admitting: Cardiovascular Disease

## 2019-09-24 ENCOUNTER — Observation Stay
Admission: EM | Admit: 2019-09-24 | Discharge: 2019-09-25 | Disposition: A | Discharge status: Home / Self Care | Payer: Medicare HMO | Attending: Internal Medicine | Admitting: Internal Medicine

## 2019-09-24 ENCOUNTER — Other Ambulatory Visit: Payer: Self-pay

## 2019-09-24 DIAGNOSIS — I2510 Atherosclerotic heart disease of native coronary artery without angina pectoris: Secondary | ICD-10-CM | POA: Diagnosis not present

## 2019-09-24 DIAGNOSIS — I214 Non-ST elevation (NSTEMI) myocardial infarction: Secondary | ICD-10-CM

## 2019-09-24 DIAGNOSIS — E785 Hyperlipidemia, unspecified: Secondary | ICD-10-CM | POA: Insufficient documentation

## 2019-09-24 DIAGNOSIS — Z79899 Other long term (current) drug therapy: Secondary | ICD-10-CM | POA: Insufficient documentation

## 2019-09-24 DIAGNOSIS — I129 Hypertensive chronic kidney disease with stage 1 through stage 4 chronic kidney disease, or unspecified chronic kidney disease: Secondary | ICD-10-CM | POA: Diagnosis not present

## 2019-09-24 DIAGNOSIS — I4891 Unspecified atrial fibrillation: Secondary | ICD-10-CM | POA: Diagnosis not present

## 2019-09-24 DIAGNOSIS — I200 Unstable angina: Secondary | ICD-10-CM

## 2019-09-24 DIAGNOSIS — R946 Abnormal results of thyroid function studies: Secondary | ICD-10-CM | POA: Diagnosis not present

## 2019-09-24 DIAGNOSIS — I10 Essential (primary) hypertension: Secondary | ICD-10-CM | POA: Diagnosis not present

## 2019-09-24 DIAGNOSIS — E7800 Pure hypercholesterolemia, unspecified: Secondary | ICD-10-CM | POA: Diagnosis not present

## 2019-09-24 DIAGNOSIS — Z7982 Long term (current) use of aspirin: Secondary | ICD-10-CM | POA: Insufficient documentation

## 2019-09-24 DIAGNOSIS — I480 Paroxysmal atrial fibrillation: Secondary | ICD-10-CM | POA: Diagnosis not present

## 2019-09-24 DIAGNOSIS — I4510 Unspecified right bundle-branch block: Secondary | ICD-10-CM | POA: Insufficient documentation

## 2019-09-24 DIAGNOSIS — Z7901 Long term (current) use of anticoagulants: Secondary | ICD-10-CM | POA: Insufficient documentation

## 2019-09-24 DIAGNOSIS — N183 Chronic kidney disease, stage 3 unspecified: Secondary | ICD-10-CM | POA: Diagnosis present

## 2019-09-24 DIAGNOSIS — N1832 Chronic kidney disease, stage 3b: Secondary | ICD-10-CM | POA: Insufficient documentation

## 2019-09-24 DIAGNOSIS — R079 Chest pain, unspecified: Secondary | ICD-10-CM | POA: Diagnosis not present

## 2019-09-24 DIAGNOSIS — Z20822 Contact with and (suspected) exposure to covid-19: Secondary | ICD-10-CM | POA: Diagnosis not present

## 2019-09-24 HISTORY — DX: Chronic kidney disease, stage 3 unspecified: N18.30

## 2019-09-24 HISTORY — PX: LEFT HEART CATH AND CORONARY ANGIOGRAPHY: CATH118249

## 2019-09-24 LAB — PROTIME-INR
INR: 1 (ref 0.8–1.2)
Prothrombin Time: 12.3 seconds (ref 11.4–15.2)

## 2019-09-24 LAB — LIPID PANEL
Cholesterol: 226 mg/dL — ABNORMAL HIGH (ref 0–200)
HDL: 38 mg/dL — ABNORMAL LOW (ref >40)
LDL Cholesterol: 155 mg/dL — ABNORMAL HIGH (ref 0–99)
Total CHOL/HDL Ratio: 5.9 RATIO
Triglycerides: 164 mg/dL — ABNORMAL HIGH (ref <150)
VLDL: 33 mg/dL (ref 0–40)

## 2019-09-24 LAB — TROPONIN I (HIGH SENSITIVITY)
Troponin I (High Sensitivity): 253 ng/L (ref <18)
Troponin I (High Sensitivity): 612 ng/L (ref <18)

## 2019-09-24 LAB — BASIC METABOLIC PANEL
Anion gap: 10 (ref 5–15)
BUN: 23 mg/dL (ref 8–23)
CO2: 25 mmol/L (ref 22–32)
Calcium: 9.4 mg/dL (ref 8.9–10.3)
Chloride: 103 mmol/L (ref 98–111)
Creatinine, Ser: 1.65 mg/dL — ABNORMAL HIGH (ref 0.61–1.24)
GFR calc Af Amer: 46 mL/min — ABNORMAL LOW (ref >60)
GFR calc non Af Amer: 40 mL/min — ABNORMAL LOW (ref >60)
Glucose, Bld: 109 mg/dL — ABNORMAL HIGH (ref 70–99)
Potassium: 4.4 mmol/L (ref 3.5–5.1)
Sodium: 138 mmol/L (ref 135–145)

## 2019-09-24 LAB — HEMOGLOBIN A1C
Hgb A1c MFr Bld: 5.6 % (ref 4.8–5.6)
Mean Plasma Glucose: 114.02 mg/dL

## 2019-09-24 LAB — CBC
HCT: 44.6 % (ref 39.0–52.0)
Hemoglobin: 15.4 g/dL (ref 13.0–17.0)
MCH: 27.4 pg (ref 26.0–34.0)
MCHC: 34.5 g/dL (ref 30.0–36.0)
MCV: 79.4 fL — ABNORMAL LOW (ref 80.0–100.0)
Platelets: 205 10*3/uL (ref 150–400)
RBC: 5.62 MIL/uL (ref 4.22–5.81)
RDW: 13.4 % (ref 11.5–15.5)
WBC: 7.1 10*3/uL (ref 4.0–10.5)
nRBC: 0 % (ref 0.0–0.2)

## 2019-09-24 LAB — TSH: TSH: 5.347 u[IU]/mL — ABNORMAL HIGH (ref 0.350–4.500)

## 2019-09-24 LAB — ECHOCARDIOGRAM COMPLETE
Height: 74 in
Weight: 3680 oz

## 2019-09-24 LAB — APTT: aPTT: 27 seconds (ref 24–36)

## 2019-09-24 LAB — SARS CORONAVIRUS 2 BY RT PCR (HOSPITAL ORDER, PERFORMED IN ~~LOC~~ HOSPITAL LAB): SARS Coronavirus 2: NEGATIVE

## 2019-09-24 LAB — MAGNESIUM: Magnesium: 1.9 mg/dL (ref 1.7–2.4)

## 2019-09-24 SURGERY — LEFT HEART CATH AND CORONARY ANGIOGRAPHY
Anesthesia: Moderate Sedation

## 2019-09-24 MED ORDER — SODIUM CHLORIDE 0.9% FLUSH
3.0000 mL | Freq: Two times a day (BID) | INTRAVENOUS | Status: DC
  Administered 2019-09-24: 3 mL via INTRAVENOUS

## 2019-09-24 MED ORDER — ASPIRIN 300 MG RE SUPP: 300.0000 mg | RECTAL | Status: AC

## 2019-09-24 MED ORDER — MIDAZOLAM HCL 2 MG/2ML IJ SOLN
INTRAMUSCULAR | Status: AC
  Filled 2019-09-24: qty 2

## 2019-09-24 MED ORDER — ONDANSETRON HCL 4 MG/2ML IJ SOLN: 4.0000 mg | Freq: Four times a day (QID) | INTRAMUSCULAR | Status: DC | PRN

## 2019-09-24 MED ORDER — METOPROLOL SUCCINATE ER 25 MG PO TB24
25.0000 mg | ORAL_TABLET | Freq: Every day | ORAL | Status: DC
  Administered 2019-09-24 – 2019-09-25 (×2): 25 mg via ORAL
  Filled 2019-09-24 (×3): qty 1

## 2019-09-24 MED ORDER — HEPARIN SODIUM (PORCINE) 1000 UNIT/ML IJ SOLN
INTRAMUSCULAR | Status: AC
  Filled 2019-09-24: qty 1

## 2019-09-24 MED ORDER — SODIUM CHLORIDE 0.9 % IV BOLUS
250.0000 mL | Freq: Once | INTRAVENOUS | Status: AC
  Administered 2019-09-24: 250 mL via INTRAVENOUS

## 2019-09-24 MED ORDER — SODIUM CHLORIDE 0.9 % IV SOLN: INTRAVENOUS | Status: AC

## 2019-09-24 MED ORDER — ATORVASTATIN CALCIUM 20 MG PO TABS
20.0000 mg | ORAL_TABLET | Freq: Every day | ORAL | Status: DC
  Administered 2019-09-25: 20 mg via ORAL
  Filled 2019-09-24: qty 1

## 2019-09-24 MED ORDER — LIDOCAINE HCL (PF) 1 % IJ SOLN
INTRAMUSCULAR | Status: AC
  Filled 2019-09-24: qty 30

## 2019-09-24 MED ORDER — ASPIRIN 81 MG PO CHEW
324.0000 mg | CHEWABLE_TABLET | Freq: Once | ORAL | Status: AC
  Administered 2019-09-24: 324 mg via ORAL
  Filled 2019-09-24: qty 4

## 2019-09-24 MED ORDER — SODIUM CHLORIDE 0.9% FLUSH: 3.0000 mL | INTRAVENOUS | Status: DC | PRN

## 2019-09-24 MED ORDER — ASPIRIN 81 MG PO TBEC: 81.0000 mg | DELAYED_RELEASE_TABLET | Freq: Every day | ORAL | Status: DC

## 2019-09-24 MED ORDER — HEPARIN (PORCINE) IN NACL 1000-0.9 UT/500ML-% IV SOLN
INTRAVENOUS | Status: DC | PRN
  Administered 2019-09-24: 500 mL

## 2019-09-24 MED ORDER — ASPIRIN EC 81 MG PO TBEC
81.0000 mg | DELAYED_RELEASE_TABLET | Freq: Every day | ORAL | Status: DC
  Administered 2019-09-25: 81 mg via ORAL
  Filled 2019-09-24: qty 1

## 2019-09-24 MED ORDER — LOSARTAN POTASSIUM 50 MG PO TABS
50.0000 mg | ORAL_TABLET | Freq: Every day | ORAL | Status: DC
  Administered 2019-09-24: 50 mg via ORAL
  Filled 2019-09-24: qty 1

## 2019-09-24 MED ORDER — IOHEXOL 300 MG/ML  SOLN
INTRAMUSCULAR | Status: DC | PRN
  Administered 2019-09-24: 35 mL

## 2019-09-24 MED ORDER — SODIUM CHLORIDE 0.9% FLUSH
3.0000 mL | Freq: Two times a day (BID) | INTRAVENOUS | Status: DC
  Administered 2019-09-24 – 2019-09-25 (×2): 3 mL via INTRAVENOUS

## 2019-09-24 MED ORDER — VERAPAMIL HCL 2.5 MG/ML IV SOLN
INTRAVENOUS | Status: AC
  Filled 2019-09-24: qty 2

## 2019-09-24 MED ORDER — MIDAZOLAM HCL 2 MG/2ML IJ SOLN
INTRAMUSCULAR | Status: DC | PRN
  Administered 2019-09-24: 1 mg via INTRAVENOUS

## 2019-09-24 MED ORDER — NITROGLYCERIN 0.4 MG SL SUBL: 0.4000 mg | SUBLINGUAL_TABLET | SUBLINGUAL | Status: DC | PRN

## 2019-09-24 MED ORDER — NITROGLYCERIN 0.4 MG SL SUBL
0.4000 mg | SUBLINGUAL_TABLET | SUBLINGUAL | Status: DC | PRN
  Administered 2019-09-24: 0.4 mg via SUBLINGUAL
  Filled 2019-09-24: qty 1

## 2019-09-24 MED ORDER — HEPARIN BOLUS VIA INFUSION
4000.0000 [IU] | Freq: Once | INTRAVENOUS | Status: AC
  Administered 2019-09-24: 4000 [IU] via INTRAVENOUS
  Filled 2019-09-24: qty 4000

## 2019-09-24 MED ORDER — ACETAMINOPHEN 325 MG PO TABS: 650.0000 mg | ORAL_TABLET | Freq: Once | ORAL | Status: DC

## 2019-09-24 MED ORDER — FENTANYL CITRATE (PF) 100 MCG/2ML IJ SOLN
INTRAMUSCULAR | Status: AC
  Filled 2019-09-24: qty 2

## 2019-09-24 MED ORDER — HEPARIN SODIUM (PORCINE) 1000 UNIT/ML IJ SOLN
INTRAMUSCULAR | Status: DC | PRN
  Administered 2019-09-24: 4000 [IU] via INTRAVENOUS

## 2019-09-24 MED ORDER — SODIUM CHLORIDE 0.9 % IV SOLN: Freq: Once | INTRAVENOUS | Status: AC

## 2019-09-24 MED ORDER — ASPIRIN 81 MG PO CHEW: 81.0000 mg | CHEWABLE_TABLET | ORAL | Status: DC

## 2019-09-24 MED ORDER — LABETALOL HCL 5 MG/ML IV SOLN: 10.0000 mg | INTRAVENOUS | Status: AC | PRN

## 2019-09-24 MED ORDER — SODIUM CHLORIDE 0.9 % IV SOLN: INTRAVENOUS | Status: DC

## 2019-09-24 MED ORDER — HEPARIN (PORCINE) 25000 UT/250ML-% IV SOLN
1400.0000 [IU]/h | INTRAVENOUS | Status: DC
  Administered 2019-09-24: 1400 [IU]/h via INTRAVENOUS
  Filled 2019-09-24: qty 250

## 2019-09-24 MED ORDER — ASPIRIN 81 MG PO CHEW: 324.0000 mg | CHEWABLE_TABLET | ORAL | Status: DC

## 2019-09-24 MED ORDER — VERAPAMIL HCL 2.5 MG/ML IV SOLN
INTRAVENOUS | Status: DC | PRN
  Administered 2019-09-24: 2.5 mg via INTRA_ARTERIAL

## 2019-09-24 MED ORDER — SODIUM CHLORIDE 0.9 % IV SOLN: Freq: Once | INTRAVENOUS | Status: DC

## 2019-09-24 MED ORDER — OMEGA-3-ACID ETHYL ESTERS 1 G PO CAPS
1.0000 g | ORAL_CAPSULE | Freq: Two times a day (BID) | ORAL | Status: DC
  Administered 2019-09-24 – 2019-09-25 (×2): 1 g via ORAL
  Filled 2019-09-24 (×4): qty 1

## 2019-09-24 MED ORDER — SODIUM CHLORIDE 0.9 % IV SOLN: 250.0000 mL | INTRAVENOUS | Status: DC | PRN

## 2019-09-24 MED ORDER — FENTANYL CITRATE (PF) 100 MCG/2ML IJ SOLN
INTRAMUSCULAR | Status: DC | PRN
  Administered 2019-09-24: 50 ug via INTRAVENOUS

## 2019-09-24 MED ORDER — HEPARIN (PORCINE) IN NACL 1000-0.9 UT/500ML-% IV SOLN
INTRAVENOUS | Status: AC
  Filled 2019-09-24: qty 1000

## 2019-09-24 MED ORDER — ACETAMINOPHEN 325 MG PO TABS: 650.0000 mg | ORAL_TABLET | ORAL | Status: DC | PRN

## 2019-09-24 MED ORDER — LIDOCAINE HCL (PF) 1 % IJ SOLN
INTRAMUSCULAR | Status: DC | PRN
  Administered 2019-09-24: 30 mL

## 2019-09-24 MED ORDER — NITROGLYCERIN IN D5W 200-5 MCG/ML-% IV SOLN
0.0000 ug/min | INTRAVENOUS | Status: DC
  Administered 2019-09-24: 5 ug/min via INTRAVENOUS
  Filled 2019-09-24: qty 250

## 2019-09-24 SURGICAL SUPPLY — 9 items
CATH INFINITI 5FR JK (CATHETERS) ×3 IMPLANT
DEVICE RAD TR BAND REGULAR (VASCULAR PRODUCTS) ×3 IMPLANT
GLIDESHEATH SLEND A-KIT 6F 22G (SHEATH) ×3 IMPLANT
GUIDEWIRE INQWIRE 1.5J.035X260 (WIRE) ×1 IMPLANT
INQWIRE 1.5J .035X260CM (WIRE) ×3
KIT MANI 3VAL PERCEP (MISCELLANEOUS) ×3 IMPLANT
NEEDLE PERC 21GX4CM (NEEDLE) ×3 IMPLANT
PACK CARDIAC CATH (CUSTOM PROCEDURE TRAY) ×3 IMPLANT
WIRE NITINOL .018 (WIRE) ×3 IMPLANT

## 2019-09-24 NOTE — Consult Note (Signed)
ANTICOAGULATION CONSULT NOTE  Pharmacy Consult for Heparin Indication: chest pain/ACS  Allergies  Allergen Reactions   Cardizem [Diltiazem Hcl]    Crestor [Rosuvastatin]    Vytorin [Ezetimibe-Simvastatin]    Zocor [Simvastatin]     Patient Measurements: Height: 6\' 2"  (188 cm) Weight: 104.3 kg (230 lb) IBW/kg (Calculated) : 82.2 Heparin Dosing Weight: 103.2 kg  Vital Signs: Temp: 98.2 F (36.8 C) (06/28 0818) Temp Source: Oral (06/28 0818) BP: 176/103 (06/28 0818) Pulse Rate: 89 (06/28 0818)  Labs: Recent Labs    09/24/19 0820  HGB 15.4  HCT 44.6  PLT 205  CREATININE 1.65*  TROPONINIHS 253*    Estimated Creatinine Clearance: 49.8 mL/min (A) (by C-G formula based on SCr of 1.65 mg/dL (H)).   Medications:  (Not in a hospital admission)  Scheduled:   aspirin  324 mg Oral Once   Infusions:   nitroGLYCERIN     PRN: nitroGLYCERIN Anti-infectives (From admission, onward)   None      Assessment: Pharmacy consulted for ACS. CBC stable. No DOAC PTA.   Goal of Therapy:  Heparin level 0.3-0.7 units/ml Monitor platelets by anticoagulation protocol: Yes   Plan:  Give 4000 units bolus x 1 Start heparin infusion at  units/hr Check anti-Xa level in 1400 hours and daily while on heparin Continue to monitor H&H and platelets  Oswald Hillock, PharmD, BCPS 09/24/2019,9:40 AM

## 2019-09-24 NOTE — Progress Notes (Signed)
*  PRELIMINARY RESULTS* Echocardiogram 2D Echocardiogram has been performed.  Sherrie Sport 09/24/2019, 12:43 PM

## 2019-09-24 NOTE — Progress Notes (Signed)
During admission assessment pt had a post right radial cath. Dressing in place with gauze & tegaderm. Small hematoma present, pulses +2. Site was marked to continue to monitor. Patient educated not to use extremity.

## 2019-09-24 NOTE — ED Triage Notes (Signed)
Pt here for left sided constant dull nonradiating chest pain since yesterday. NAD. VSS. No associated sx.  Ambulatory.

## 2019-09-24 NOTE — Consult Note (Signed)
[facility] for Heparin Indication: chest pain/ACS  Allergies  Allergen Reactions  . Cardizem [Diltiazem Hcl]   . Crestor [Rosuvastatin]   . Vytorin [Ezetimibe-Simvastatin]   . Zocor [Simvastatin]     Patient Measurements: Height: 6\' 2"  (188 cm) Weight: 103.8 kg (228 lb 14.4 oz) IBW/kg (Calculated) : 82.2 Heparin Dosing Weight: 103.2 kg  Vital Signs: Temp: 98.2 F (36.8 C) (06/28 1807) Temp Source: Oral (06/28 1807) BP: 164/87 (06/28 1807) Pulse Rate: 67 (06/28 1807)  Labs: Recent Labs    09/24/19 0820 09/24/19 1016  HGB 15.4  --   HCT 44.6  --   PLT 205  --   APTT  --  27  LABPROT  --  12.3  INR  --  1.0  CREATININE 1.65*  --   TROPONINIHS 253* 612*    Estimated Creatinine Clearance: 49.7 mL/min (A) (by C-G formula based on SCr of 1.65 mg/dL (H)).   Medications:  Medications Prior to Admission  Medication Sig Dispense Refill Last Dose  . aspirin (ASPIRIN EC) 81 MG EC tablet Take 81 mg by mouth daily. Swallow whole.    09/23/2019 at Unknown time  . losartan (COZAAR) 50 MG tablet Take 50 mg by mouth daily.   09/23/2019 at Unknown time  . metoprolol succinate (TOPROL-XL) 25 MG 24 hr tablet Take 25 mg by mouth daily.   09/23/2019 at Unknown time   Scheduled:  . acetaminophen  650 mg Oral Once  . [START ON 09/25/2019] aspirin EC  81 mg Oral Daily  . aspirin  300 mg Rectal NOW  . atorvastatin  20 mg Oral Daily  . losartan  50 mg Oral Daily  . metoprolol succinate  25 mg Oral Daily  . omega-3 acid ethyl esters  1 g Oral BID  . sodium chloride flush  3 mL Intravenous Q12H   Infusions:  . sodium chloride    . sodium chloride    . heparin    . nitroGLYCERIN 7.5 mcg/min (09/24/19 1158)   PRN: sodium chloride, acetaminophen, labetalol, nitroGLYCERIN, nitroGLYCERIN, ondansetron (ZOFRAN) IV, sodium chloride flush Anti-infectives (From admission, onward)   None      Assessment: Pharmacy consulted for ACS. CBC stable. No DOAC  PTA.   Give 4000 units bolus x 1 Start heparin infusion at  units/hr Check anti-Xa level in 1400 hours and daily while on heparin Continue to monitor H&H and platelets  Goal of Therapy:  Heparin level 0.3-0.7 units/ml Monitor platelets by anticoagulation protocol: Yes   Plan:  Patient went to cath lab 6/28. Restart Heparin per consult 6 hours post sheath removal. Per RN sheath removed at 1700. Will order Heparin drip to start at 2300 at 1400 units/hr. F/u Heparin level in 8 hours. CBC in am  Psalm Arman A, PharmD, BCPS 09/24/2019,6:22 PM

## 2019-09-24 NOTE — H&P (Signed)
History and Physical    Joseph Roberson:[id number] DOB: February 16, 1946 DOA: 09/24/2019  PCP: Derinda Late, MD   Patient coming from: Home  I have personally briefly reviewed patient's old medical records in [place]  Chief Complaint: Chest pain  HPI: Joseph Roberson is a 74 y.o. male with medical history significant for hypertension, paroxysmal atrial fibrillation, chronic kidney disease stage III who presents to the emergency room for evaluation of left-sided, constant, dull, pain over the left anterior chest wall which patient has had for about 24 hours.  He denies having any aggravating or relieving factors.  He denies any associated nausea, vomiting, diaphoresis or palpitations. He denies having any abdominal pain, no changes in his bowel habits, no fever, no cough, no chills, no dizziness or lightheadedness. Patient has an elevation in his troponin from 253 >> 612 Chest x-ray showed no acute findings Twelve-lead EKG shows A. fib with a incomplete right bundle branch block   ED Course: Patient is a 74 year old Caucasian male who is relatively healthy with a history of hypertension who presents to the ER for evaluation of left-sided chest pain which he has had for about 24 hours.  He denies having any relieving or aggravating factors.  Patient has an elevated troponin level and EKG shows atrial fibrillation.  Patient with exacerbation status for further evaluation.  Review of Systems: As per HPI otherwise 10 point review of systems negative.    Past Medical History:  Diagnosis Date  . Arthritis   . CKD (chronic kidney disease), stage III   . Dysrhythmia   . Erectile dysfunction   . Hypercholesteremia   . Hyperlipidemia   . Hypertension   . PAF (paroxysmal atrial fibrillation) ([place])     Past Surgical History:  Procedure Laterality Date  . COLONOSCOPY WITH PROPOFOL N/A 03/07/2015   Procedure: COLONOSCOPY WITH PROPOFOL;  Surgeon: Manya Silvas, MD;  Location:  [facility] ENDOSCOPY;  Service: Endoscopy;  Laterality: N/A;  . KNEE ARTHROSCOPY    . MENISCUS REPAIR       reports that he has never smoked. He has never used smokeless tobacco. He reports current alcohol use of about 1.0 standard drink of alcohol per week. He reports that he does not use drugs.  Allergies  Allergen Reactions  . Cardizem [Diltiazem Hcl]   . Crestor [Rosuvastatin]   . Vytorin [Ezetimibe-Simvastatin]   . Zocor [Simvastatin]     Family History  Problem Relation Age of Onset  . Heart disease Mother   . Heart disease Father   . Cancer Father      Prior to Admission medications   Medication Sig Start Date End Date Taking? Authorizing Provider  aspirin (ASPIRIN EC) 81 MG EC tablet Take 81 mg by mouth daily. Swallow whole.    Yes [provider]  losartan (COZAAR) 50 MG tablet Take 50 mg by mouth daily. 07/10/19  Yes [provider]  metoprolol succinate (TOPROL-XL) 25 MG 24 hr tablet Take 25 mg by mouth daily. 07/10/19  Yes [provider]    Physical Exam: Vitals:   09/24/19 1000 09/24/19 1007 09/24/19 1015 09/24/19 1030  BP: 138/82 (!) 146/102 (!) 147/98 (!) 178/98  Pulse: 80 78 87 67  Resp: (!) 22 18 17 16   Temp:      TempSrc:      SpO2: 97% 96% 95% 97%  Weight:      Height:         Vitals:   09/24/19 1000 09/24/19  1007 09/24/19 1015 09/24/19 1030  BP: 138/82 (!) 146/102 (!) 147/98 (!) 178/98  Pulse: 80 78 87 67  Resp: (!) 22 18 17 16   Temp:      TempSrc:      SpO2: 97% 96% 95% 97%  Weight:      Height:        Constitutional: NAD, alert and oriented x 3 Eyes: PERRL, lids and conjunctivae normal ENMT: Mucous membranes are moist.  Neck: normal, supple, no masses, no thyromegaly Respiratory: clear to auscultation bilaterally, no wheezing, no crackles. Normal respiratory effort. No accessory muscle use.  Cardiovascular: Irregularly irregular, no murmurs / rubs / gallops. No extremity edema. 2+ pedal pulses. No carotid bruits.    Abdomen: no tenderness, no masses palpated. No hepatosplenomegaly. Bowel sounds positive.  Musculoskeletal: no clubbing / cyanosis. No joint deformity upper and lower extremities.  Skin: no rashes, lesions, ulcers.  Neurologic: No gross focal neurologic deficit. Psychiatric: Normal mood and affect.   Labs on Admission: I have personally reviewed following labs and imaging studies  CBC: Recent Labs  Lab 09/24/19 0820  WBC 7.1  HGB 15.4  HCT 44.6  MCV 79.4*  PLT 378   Basic Metabolic Panel: Recent Labs  Lab 09/24/19 0820  NA 138  K 4.4  CL 103  CO2 25  GLUCOSE 109*  BUN 23  CREATININE 1.65*  CALCIUM 9.4  MG 1.9   GFR: Estimated Creatinine Clearance: 49.8 mL/min (A) (by C-G formula based on SCr of 1.65 mg/dL (H)). Liver Function Tests: No results for input(s): AST, ALT, ALKPHOS, BILITOT, PROT, ALBUMIN in the last 168 hours. No results for input(s): LIPASE, AMYLASE in the last 168 hours. No results for input(s): AMMONIA in the last 168 hours. Coagulation Profile: Recent Labs  Lab 09/24/19 1016  INR 1.0   Cardiac Enzymes: No results for input(s): CKTOTAL, CKMB, CKMBINDEX, TROPONINI in the last 168 hours. BNP (last 3 results) No results for input(s): PROBNP in the last 8760 hours. HbA1C: No results for input(s): HGBA1C in the last 72 hours. CBG: No results for input(s): GLUCAP in the last 168 hours. Lipid Profile: Recent Labs    09/24/19 0820  CHOL 226*  HDL 38*  LDLCALC 155*  TRIG 164*  CHOLHDL 5.9   Thyroid Function Tests: Recent Labs    09/24/19 0820  TSH 5.347*   Anemia Panel: No results for input(s): VITAMINB12, FOLATE, FERRITIN, TIBC, IRON, RETICCTPCT in the last 72 hours. Urine analysis: No results found for: COLORURINE, APPEARANCEUR, LABSPEC, PHURINE, GLUCOSEU, HGBUR, BILIRUBINUR, KETONESUR, PROTEINUR, UROBILINOGEN, NITRITE, LEUKOCYTESUR  Radiological Exams on Admission: DG Chest 2 View  Result Date: 09/24/2019 CLINICAL DATA:   74 year old male with persistent left side nonradiating chest pain. EXAM: CHEST - 2 VIEW COMPARISON:  Chest CT and radiographs 04/02/2005. FINDINGS: Lung volumes and mediastinal contours remain normal. Visualized tracheal air column is within normal limits. Both lungs appear clear. No pneumothorax or pleural effusion. Negative visible bowel gas pattern and osseous structures. IMPRESSION: Negative.  No acute cardiopulmonary abnormality. Electronically Signed   By: Genevie Ann M.D.   On: 09/24/2019 08:40    EKG: Independently reviewed.  Atrial fibrillation Incomplete right bundle branch block  Assessment/Plan Principal Problem:   NSTEMI (non-ST elevated myocardial infarction) ([place]) Active Problems:   CKD (chronic kidney disease), stage III   PAF (paroxysmal atrial fibrillation) ([facility])   Hypercholesteremia   Hypertension     Acute non-ST elevation MI Patient presents for evaluation of chest pain and has an elevated troponin  level with no acute EKG changes Continue heparin drip started in the emergency room Continue aspirin, fish oil and beta-blockers We will request cardiology consult   Hypertension Blood pressure not at goal Continue metoprolol and Cozaar   Chronic kidney disease stage 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 Fib Rate controlled on metoprolol Patient has a CHADS2VASC score of 3 and ideally requires long-term anticoagulation as primary prophylaxis for an acute stroke We will obtain 2D echocardiogram to rule out valvular pathology Will defer to initiation of anticoagulation to cardiology   Dyslipidemia Patient has allergy to statins We will place patient on fish oil     DVT prophylaxis: Heparin Code Status: Full code Family Communication: Greater than 50% of time was spent discussing care with patient.  He verbalizes understanding and agrees with plan. Disposition Plan: Back to previous home environment Consults called:  Cardiology    Miranda Frese MD [facility]     09/24/2019, 11:38 AM

## 2019-09-24 NOTE — ED Provider Notes (Signed)
[facility] Emergency Department Provider Note    None    (approximate)  I have reviewed the triage vital signs and the nursing notes.   HISTORY  Chief Complaint Chest Pain    HPI Joseph Roberson is a 74 y.o. male [place] past medical history presents to the ER for 24 hours of left-sided chest pressure and discomfort he describes as a dull aching pain is been constant.  States it started midday yesterday.  Denies any diaphoresis but does feel generalized weakness.  Is he has a history of A. fib not on any anticoagulation has been several years since he has had that.  States he is typically very active.  Has no known history of CAD.  Denies any chest pain ripping or tearing through to his back.    Past Medical History:  Diagnosis Date  . Arthritis   . CKD (chronic kidney disease), stage III   . Dysrhythmia   . Erectile dysfunction   . Hypercholesteremia   . Hyperlipidemia   . Hypertension   . PAF (paroxysmal atrial fibrillation) ([facility])    Family History  Problem Relation Age of Onset  . Heart disease Mother   . Heart disease Father   . Cancer Father    Past Surgical History:  Procedure Laterality Date  . COLONOSCOPY WITH PROPOFOL N/A 03/07/2015   Procedure: COLONOSCOPY WITH PROPOFOL;  Surgeon: Manya Silvas, MD;  Location: [facility] ENDOSCOPY;  Service: Endoscopy;  Laterality: N/A;  . KNEE ARTHROSCOPY    . MENISCUS REPAIR     There are no problems to display for this patient.     Prior to Admission medications   Medication Sig Start Date End Date Taking? Authorizing Provider  aspirin (ASPIRIN EC) 81 MG EC tablet Take 81 mg by mouth daily. Swallow whole.    Yes [provider]  losartan (COZAAR) 50 MG tablet Take 50 mg by mouth daily. 07/10/19  Yes [provider]  metoprolol succinate (TOPROL-XL) 25 MG 24 hr tablet Take 25 mg by mouth daily. 07/10/19  Yes [provider]    Allergies Cardizem [diltiazem hcl],  Crestor [rosuvastatin], Vytorin [ezetimibe-simvastatin], and Zocor [simvastatin]    Social History Social History   Tobacco Use  . Smoking status: Never Smoker  . Smokeless tobacco: Never Used  Substance Use Topics  . Alcohol use: Yes    Alcohol/week: 1.0 standard drink    Types: 1 Cans of beer per week  . Drug use: No    Review of Systems Patient denies headaches, rhinorrhea, blurry vision, numbness, shortness of breath, chest pain, edema, cough, abdominal pain, nausea, vomiting, diarrhea, dysuria, fevers, rashes or hallucinations unless otherwise stated above in HPI. ____________________________________________   PHYSICAL EXAM:  VITAL SIGNS: Vitals:   09/24/19 1015 09/24/19 1030  BP: (!) 147/98 (!) 178/98  Pulse: 87 67  Resp: 17 16  Temp:    SpO2: 95% 97%    Constitutional: Alert and oriented.  Eyes: Conjunctivae are normal.  Head: Atraumatic. Nose: No congestion/rhinnorhea. Mouth/Throat: Mucous membranes are moist.   Neck: No stridor. Painless ROM.  Cardiovascular: Normal rate, regular rhythm. Grossly normal heart sounds.  Good peripheral circulation. Respiratory: Normal respiratory effort.  No retractions. Lungs CTAB. Gastrointestinal: Soft and nontender. No distention. No abdominal bruits. No CVA tenderness. Genitourinary:  Musculoskeletal: No lower extremity tenderness nor edema.  No joint effusions. Neurologic:  Normal speech and language. No gross focal neurologic deficits are appreciated. No facial droop Skin:  Skin is warm,  dry and intact. No rash noted. Psychiatric: Mood and affect are normal. Speech and behavior are normal.  ____________________________________________   LABS (all labs ordered are listed, but only abnormal results are displayed)  Results for orders placed or performed during the hospital encounter of 09/24/19 (from the past 24 hour(s))  Basic metabolic panel     Status: Abnormal   Collection Time: 09/24/19  8:20 AM  Result Value  Ref Range   Sodium 138 135 - 145 mmol/L   Potassium 4.4 3.5 - 5.1 mmol/L   Chloride 103 98 - 111 mmol/L   CO2 25 22 - 32 mmol/L   Glucose, Bld 109 (H) 70 - 99 mg/dL   BUN 23 8 - 23 mg/dL   Creatinine, Ser 1.65 (H) 0.61 - 1.24 mg/dL   Calcium 9.4 8.9 - 10.3 mg/dL   GFR calc non Af Amer 40 (L) >60 mL/min   GFR calc Af Amer 46 (L) >60 mL/min   Anion gap 10 5 - 15  CBC     Status: Abnormal   Collection Time: 09/24/19  8:20 AM  Result Value Ref Range   WBC 7.1 4.0 - 10.5 K/uL   RBC 5.62 4.22 - 5.81 MIL/uL   Hemoglobin 15.4 13.0 - 17.0 g/dL   HCT 44.6 39 - 52 %   MCV 79.4 (L) 80.0 - 100.0 fL   MCH 27.4 26.0 - 34.0 pg   MCHC 34.5 30.0 - 36.0 g/dL   RDW 13.4 11.5 - 15.5 %   Platelets 205 150 - 400 K/uL   nRBC 0.0 0.0 - 0.2 %  Troponin I (High Sensitivity)     Status: Abnormal   Collection Time: 09/24/19  8:20 AM  Result Value Ref Range   Troponin I (High Sensitivity) 253 (HH) <18 ng/L  Lipid panel     Status: Abnormal   Collection Time: 09/24/19  8:20 AM  Result Value Ref Range   Cholesterol 226 (H) 0 - 200 mg/dL   Triglycerides 164 (H) <150 mg/dL   HDL 38 (L) >40 mg/dL   Total CHOL/HDL Ratio 5.9 RATIO   VLDL 33 0 - 40 mg/dL   LDL Cholesterol 155 (H) 0 - 99 mg/dL  TSH     Status: Abnormal   Collection Time: 09/24/19  8:20 AM  Result Value Ref Range   TSH 5.347 (H) 0.350 - 4.500 uIU/mL  Magnesium     Status: None   Collection Time: 09/24/19  8:20 AM  Result Value Ref Range   Magnesium 1.9 1.7 - 2.4 mg/dL  APTT     Status: None   Collection Time: 09/24/19 10:16 AM  Result Value Ref Range   aPTT 27 24 - 36 seconds  Protime-INR     Status: None   Collection Time: 09/24/19 10:16 AM  Result Value Ref Range   Prothrombin Time 12.3 11.4 - 15.2 seconds   INR 1.0 0.8 - 1.2  Troponin I (High Sensitivity)     Status: Abnormal   Collection Time: 09/24/19 10:16 AM  Result Value Ref Range   Troponin I (High Sensitivity) 612 (HH) <18 ng/L    ____________________________________________  EKG My review and personal interpretation at Time: 8:10   Indication: chest pain  Rate: 85  Rhythm: afib Axis: normal Other: normal intervals, no stemi ____________________________________________  RADIOLOGY  I personally reviewed all radiographic images ordered to evaluate for the above acute complaints and reviewed radiology reports and findings.  These findings were personally discussed with the patient.  Please see medical record for radiology report.  ____________________________________________   PROCEDURES  Procedure(s) performed:  .Critical Care Performed by: Merlyn Lot, MD Authorized by: Merlyn Lot, MD   Critical care provider statement:    Critical care time (minutes):  35   Critical care time was exclusive of:  Separately billable procedures and treating other patients   Critical care was necessary to treat or prevent imminent or life-threatening deterioration of the following conditions:  Cardiac failure   Critical care was time spent personally by me on the following activities:  Development of treatment plan with patient or surrogate, discussions with consultants, evaluation of patient's response to treatment, examination of patient, obtaining history from patient or surrogate, ordering and performing treatments and interventions, ordering and review of laboratory studies, ordering and review of radiographic studies, pulse oximetry, re-evaluation of patient's condition and review of old charts      Critical Care performed: yes ____________________________________________   INITIAL IMPRESSION / [place] / ED COURSE  Pertinent labs & imaging results that were available during my care of the patient were reviewed by me and considered in my medical decision making (see chart for details).   DDX: ACS, pericarditis, esophagitis, boerhaaves, pe, dissection, pna, bronchitis,  costochondritis   JOVAUN LEVENE is a 74 y.o. who presents to the ED with chest discomfort and symptoms as described above.  Patient protecting his airway and appears clinically well exam.  Has nonspecific changes on his EKG and A. fib but does have troponin elevation concerning for NSTEMI.  Normal exam soft and benign.  Mild renal dysfunction will give IV fluids.  Will give aspirin, heparin and nitro.  Have consulted cardiology.  Will discuss with hospitalist for admission.     The patient was evaluated in Emergency Department today for the symptoms described in the history of present illness. He/she was evaluated in the context of the global COVID-19 pandemic, which necessitated consideration that the patient might be at risk for infection with the SARS-CoV-2 virus that causes COVID-19. Institutional protocols and algorithms that pertain to the evaluation of patients at risk for COVID-19 are in a state of rapid change based on information released by regulatory bodies including the CDC and federal and state organizations. These policies and algorithms were followed during the patient's care in the ED.  As part of my medical decision making, I reviewed the following data within the [facility] notes reviewed and incorporated, Labs reviewed, notes from prior ED visits and [place] Controlled Substance Database   ____________________________________________   FINAL CLINICAL IMPRESSION(S) / ED DIAGNOSES  Final diagnoses:  Unstable angina ([place])      NEW MEDICATIONS STARTED DURING THIS VISIT:  New Prescriptions   No medications on file     Note:  This document was prepared using Dragon voice recognition software and may include unintentional dictation errors.    Merlyn Lot, MD 09/24/19 1136

## 2019-09-24 NOTE — Consult Note (Signed)
Cardiology Consultation:   Patient ID: Joseph Roberson; [id number]; 10-02-1945   Admit date: 09/24/2019 Date of Consult: 09/24/2019  Primary Care Provider: Derinda Late, MD Primary Cardiologist: New to [facility] - consult by Fletcher Anon Primary Electrophysiologist:  None   Patient Profile:   Joseph Roberson is a 74 y.o. male with a hx of PAF on ASA only followed by PCP, PMR, CKD stage III, HTN, and HLD who is being seen today for the evaluation of elevated troponin at the request of Dr. Francine Graven.  History of Present Illness:   Mr. Holcomb previously underwent remote LHC in 07/1983 which showed normal coronary arteries with preserved LVSF. Stress test through [place] in 03/2015 is not available for review, though thinks this was normal.   He has reported PAF, which has been managed by his PCP and has been on metoprolol and ASA only. Remote EKG from 2007 showed sinus bradycardia, otherwise no prior studies available for review.   He has been in his usual state of health until sometime during the day on 5/42 (he is uncertain when) at which time he developed a 5-6/10 left sided chest pain that was described as dull and without radiation or associated symptoms. Nothing made the pain worse or better. It was not exertional and did not limit his usual routine. He has not had pain like this before. His pain has been constant. He got up and went to work this morning, though due to persistent symptoms, he proceeding to the ED for evaluation.   Upon the patient's arrival to [facility] they were found to have BP 176/103, HR 70s to 80s bpm, temp afebrile, oxygen saturation 98% on room air, weight 104.3 kg. EKG showed Afib, 86 bpm, incomplete RBBB, CXR showed no acute process. Labs showed an initial HS-Tn of 253 with a delta that is currently pending, HGB 15.4, potassium 4.4, BUN/SCr 23/1.65. In the ED, he was given ASA 324 mg x 1, SL NTG and placed on heparin and nitro gtts. Currently, notes improved angina with  heparin and nitro gtts. He would like to see the trend of HS-Tn prior to proceeding with LHC.    Past Medical History:  Diagnosis Date   Arthritis    CKD (chronic kidney disease), stage III    Dysrhythmia    Erectile dysfunction    Hypercholesteremia    Hyperlipidemia    Hypertension    PAF (paroxysmal atrial fibrillation) ([place])     Past Surgical History:  Procedure Laterality Date   COLONOSCOPY WITH PROPOFOL N/A 03/07/2015   Procedure: COLONOSCOPY WITH PROPOFOL;  Surgeon: Manya Silvas, MD;  Location: [facility];  Service: Endoscopy;  Laterality: N/A;   KNEE ARTHROSCOPY     MENISCUS REPAIR       Home Meds: Prior to Admission medications   Medication Sig Start Date End Date Taking? Authorizing Provider  aspirin (ASPIRIN EC) 81 MG EC tablet Take 81 mg by mouth daily. Swallow whole.    Yes [provider]  losartan (COZAAR) 50 MG tablet Take 50 mg by mouth daily. 07/10/19  Yes [provider]  metoprolol succinate (TOPROL-XL) 25 MG 24 hr tablet Take 25 mg by mouth daily. 07/10/19  Yes [provider]    Inpatient Medications: Scheduled Meds:  acetaminophen  650 mg Oral Once   Continuous Infusions:  heparin 1,400 Units/hr (09/24/19 1009)   nitroGLYCERIN 5 mcg/min (09/24/19 1014)   PRN Meds: nitroGLYCERIN  Allergies:   Allergies  Allergen Reactions  Cardizem [Diltiazem Hcl]    Crestor [Rosuvastatin]    Vytorin [Ezetimibe-Simvastatin]    Zocor [Simvastatin]     Social History:   Social History   Socioeconomic History   Marital status: Divorced    Spouse name: Not on file   Number of children: Not on file   Years of education: Not on file   Highest education level: Not on file  Occupational History   Not on file  Tobacco Use   Smoking status: Never Smoker   Smokeless tobacco: Never Used  Substance and Sexual Activity   Alcohol use: Yes    Alcohol/week: 1.0 standard drink    Types: 1 Cans of beer per  week   Drug use: No   Sexual activity: Not on file  Other Topics Concern   Not on file  Social History Narrative   Not on file   Social Determinants of Health   Financial Resource Strain:    Difficulty of Paying Living Expenses:   Food Insecurity:    Worried About Charity fundraiser in the Last Year:    Arboriculturist in the Last Year:   Transportation Needs:    Film/video editor (Medical):    Lack of Transportation (Non-Medical):   Physical Activity:    Days of Exercise per Week:    Minutes of Exercise per Session:   Stress:    Feeling of Stress :   Social Connections:    Frequency of Communication with Friends and Family:    Frequency of Social Gatherings with Friends and Family:    Attends Religious Services:    Active Member of Clubs or Organizations:    Attends Music therapist:    Marital Status:   Intimate Partner Violence:    Fear of Current or Ex-Partner:    Emotionally Abused:    Physically Abused:    Sexually Abused:      Family History:   Family History  Problem Relation Age of Onset   Heart disease Mother    Heart disease Father    Cancer Father     ROS:  Review of Systems  Constitutional: Negative for chills, diaphoresis, fever, malaise/fatigue and weight loss.  HENT: Negative for congestion.   Eyes: Negative for discharge and redness.  Respiratory: Negative for cough, sputum production, shortness of breath and wheezing.   Cardiovascular: Positive for chest pain. Negative for palpitations, orthopnea, claudication, leg swelling and PND.  Gastrointestinal: Negative for abdominal pain, blood in stool, heartburn, melena, nausea and vomiting.  Musculoskeletal: Negative for falls and myalgias.  Skin: Negative for rash.  Neurological: Negative for dizziness, tingling, tremors, sensory change, speech change, focal weakness, loss of consciousness and weakness.  Endo/Heme/Allergies: Does not bruise/bleed easily.    Psychiatric/Behavioral: Negative for substance abuse. The patient is not nervous/anxious.   All other systems reviewed and are negative.     Physical Exam/Data:   Vitals:   09/24/19 0818  BP: (!) 176/103  Pulse: 89  Resp: 18  Temp: 98.2 F (36.8 C)  TempSrc: Oral  SpO2: 98%  Weight: 104.3 kg  Height: 6\' 2"  (1.88 m)   No intake or output data in the 24 hours ending 09/24/19 1027 Filed Weights   09/24/19 0818  Weight: 104.3 kg   Body mass index is 29.53 kg/m.   Physical Exam: General: Well developed, well nourished, in no acute distress. Head: Normocephalic, atraumatic, sclera non-icteric, no xanthomas, nares without discharge.  Neck: Negative for carotid bruits. JVD not  elevated. Lungs: Clear bilaterally to auscultation without wheezes, rales, or rhonchi. Breathing is unlabored. Heart: Irregularly irregular with S1 S2. No murmurs, rubs, or gallops appreciated. Abdomen: Soft, non-tender, non-distended with normoactive bowel sounds. No hepatomegaly. No rebound/guarding. No obvious abdominal masses. Msk:  Strength and tone appear normal for age. Extremities: No clubbing or cyanosis. No edema. Distal pedal pulses are 2+ and equal bilaterally. Neuro: Alert and oriented X 3. No facial asymmetry. No focal deficit. Moves all extremities spontaneously. Psych:  Responds to questions appropriately with a normal affect.   EKG:  The EKG was personally reviewed and demonstrates: Afib, 86 bpm, incomplete RBBB Telemetry:  Telemetry was personally reviewed and demonstrates: Afib with ventricular rates in the 60s bpm  Weights: Filed Weights   09/24/19 0818  Weight: 104.3 kg    Relevant CV Studies:  LHC 07/1983: Left main normal, LAD normal, LCx normal, RCA normal, LVSF 72%  Laboratory Data:  Chemistry Recent Labs  Lab 09/24/19 0820  NA 138  K 4.4  CL 103  CO2 25  GLUCOSE 109*  BUN 23  CREATININE 1.65*  CALCIUM 9.4  GFRNONAA 40*  GFRAA 46*  ANIONGAP 10    No  results for input(s): PROT, ALBUMIN, AST, ALT, ALKPHOS, BILITOT in the last 168 hours. Hematology Recent Labs  Lab 09/24/19 0820  WBC 7.1  RBC 5.62  HGB 15.4  HCT 44.6  MCV 79.4*  MCH 27.4  MCHC 34.5  RDW 13.4  PLT 205   Cardiac EnzymesNo results for input(s): TROPONINI in the last 168 hours. No results for input(s): TROPIPOC in the last 168 hours.  BNPNo results for input(s): BNP, PROBNP in the last 168 hours.  DDimer No results for input(s): DDIMER in the last 168 hours.  Radiology/Studies:  DG Chest 2 View  Result Date: 09/24/2019 IMPRESSION: Negative.  No acute cardiopulmonary abnormality. Electronically Signed   By: Genevie Ann M.D.   On: 09/24/2019 08:40    Assessment and Plan:   1. NSTEMI: -Currently without chest pain -Initial troponin 253 with delta 612, continue to cycle until peak -ASA -Heparin gtt -Nitro gtt -NPO -Echo -LHC -Check lipid and A1c for further risk stratification  -Risks and benefits of cardiac catheterization have been discussed with the patient including risks of bleeding, bruising, infection, kidney damage, stroke, heart attack, urgent need for bypass, injury to a limb, and death. The patient understands these risks and is willing to proceed with the procedure. All questions have been answered and concerns listened to  2. Afib: -Uncertain chronicity  -Ventricular rate well controlled -Toprol XL -CHADS2VASc at least 3 (HTN, age x 2) currently -Heparin gtt for now -Will need DOAC at time of discharge -Check TSH and magnesium  -Potassium at goal  3. CKD stage III: -Gentle IV hydration pre and post cath -Monitor  4. HTN: -Blood pressure elevated in the ED -Continue PTA Toprol XL -Nitro gtt -Hold losartan with CKD and planned LHC, resume prior to discharge -PRN hydralazine   5. HLD: -Check lipid panel -Intolerant to statins -Trial of Zetia -May need PCSK-9 inhibitor based on lipid panel   For questions or updates, please contact  [facility] Please consult [email or web address] for contact info under Cardiology/STEMI.   Signed, Christell Faith, PA-C [place] Pager: [phone] 09/24/2019, 10:27 AM

## 2019-09-25 ENCOUNTER — Encounter: Payer: Self-pay | Admitting: Cardiovascular Disease

## 2019-09-25 DIAGNOSIS — I159 Secondary hypertension, unspecified: Secondary | ICD-10-CM

## 2019-09-25 DIAGNOSIS — I480 Paroxysmal atrial fibrillation: Secondary | ICD-10-CM

## 2019-09-25 DIAGNOSIS — E7800 Pure hypercholesterolemia, unspecified: Secondary | ICD-10-CM | POA: Diagnosis not present

## 2019-09-25 DIAGNOSIS — I4819 Other persistent atrial fibrillation: Secondary | ICD-10-CM

## 2019-09-25 DIAGNOSIS — I214 Non-ST elevation (NSTEMI) myocardial infarction: Secondary | ICD-10-CM | POA: Diagnosis not present

## 2019-09-25 DIAGNOSIS — N183 Chronic kidney disease, stage 3 unspecified: Secondary | ICD-10-CM | POA: Diagnosis not present

## 2019-09-25 LAB — CBC
HCT: 46.5 % (ref 39.0–52.0)
Hemoglobin: 15.7 g/dL (ref 13.0–17.0)
MCH: 27.5 pg (ref 26.0–34.0)
MCHC: 33.8 g/dL (ref 30.0–36.0)
MCV: 81.4 fL (ref 80.0–100.0)
Platelets: 199 10*3/uL (ref 150–400)
RBC: 5.71 MIL/uL (ref 4.22–5.81)
RDW: 13.5 % (ref 11.5–15.5)
WBC: 6 10*3/uL (ref 4.0–10.5)
nRBC: 0 % (ref 0.0–0.2)

## 2019-09-25 LAB — BASIC METABOLIC PANEL
Anion gap: 8 (ref 5–15)
BUN: 19 mg/dL (ref 8–23)
CO2: 25 mmol/L (ref 22–32)
Calcium: 9.1 mg/dL (ref 8.9–10.3)
Chloride: 107 mmol/L (ref 98–111)
Creatinine, Ser: 1.49 mg/dL — ABNORMAL HIGH (ref 0.61–1.24)
GFR calc Af Amer: 52 mL/min — ABNORMAL LOW (ref >60)
GFR calc non Af Amer: 45 mL/min — ABNORMAL LOW (ref >60)
Glucose, Bld: 102 mg/dL — ABNORMAL HIGH (ref 70–99)
Potassium: 4.3 mmol/L (ref 3.5–5.1)
Sodium: 140 mmol/L (ref 135–145)

## 2019-09-25 LAB — LIPID PANEL
Cholesterol: 226 mg/dL — ABNORMAL HIGH (ref 0–200)
HDL: 40 mg/dL — ABNORMAL LOW (ref >40)
LDL Cholesterol: 161 mg/dL — ABNORMAL HIGH (ref 0–99)
Total CHOL/HDL Ratio: 5.7 RATIO
Triglycerides: 126 mg/dL (ref <150)
VLDL: 25 mg/dL (ref 0–40)

## 2019-09-25 LAB — T4, FREE: Free T4: 0.75 ng/dL (ref 0.61–1.12)

## 2019-09-25 LAB — HEPARIN LEVEL (UNFRACTIONATED): Heparin Unfractionated: 0.54 IU/mL (ref 0.30–0.70)

## 2019-09-25 MED ORDER — OMEGA-3-ACID ETHYL ESTERS 1 G PO CAPS: 1.0000 g | ORAL_CAPSULE | Freq: Two times a day (BID) | ORAL | 0 refills | Status: DC

## 2019-09-25 MED ORDER — ATORVASTATIN CALCIUM 20 MG PO TABS: 20.0000 mg | ORAL_TABLET | Freq: Every day | ORAL | 0 refills | Status: DC

## 2019-09-25 MED ORDER — LOSARTAN POTASSIUM 50 MG PO TABS
100.0000 mg | ORAL_TABLET | Freq: Every day | ORAL | Status: DC
  Administered 2019-09-25: 100 mg via ORAL
  Filled 2019-09-25: qty 2

## 2019-09-25 MED ORDER — LOSARTAN POTASSIUM 100 MG PO TABS: 100.0000 mg | ORAL_TABLET | Freq: Every day | ORAL | 1 refills | Status: DC

## 2019-09-25 MED ORDER — APIXABAN 5 MG PO TABS: 5.0000 mg | ORAL_TABLET | Freq: Two times a day (BID) | ORAL | 0 refills | Status: DC

## 2019-09-25 MED ORDER — APIXABAN 5 MG PO TABS
5.0000 mg | ORAL_TABLET | Freq: Two times a day (BID) | ORAL | Status: DC
  Administered 2019-09-25: 5 mg via ORAL
  Filled 2019-09-25: qty 1

## 2019-09-25 NOTE — Progress Notes (Signed)
Patient discharging to home. No questions or complaints. AVS given and reviewed with patient by Britt Bolognese RN. IV and telemetry removed. Escorted via wheelchair.

## 2019-09-25 NOTE — Discharge Summary (Signed)
Physician Discharge Summary  Joseph Roberson:[id number] DOB: 17-Dec-1945 DOA: 09/24/2019  PCP: Derinda Late, MD  Admit date: 09/24/2019 Discharge date: 09/25/2019  Time spent: 45 minutes  Recommendations for Outpatient Follow-up:  Patient will be discharged to home.  Patient will need to follow up with primary care provider within one week of discharge.  Should have thyroid studies followed up on.  Follow-up with cardiology in 1 to 2 weeks.  Patient should continue medications as prescribed.  Patient should follow a heart healthy diet.   Discharge Diagnoses:  Acute NSTEMI Essential hypertension Chronic kidney disease, stage IIIb Paroxysmal atrial fibrillation Abnormal TSH Dyslipidemia  Discharge Condition: Stable  Diet recommendation: heart healthy  Filed Weights   09/24/19 1435 09/24/19 1807 09/25/19 0623  Weight: 104.3 kg 103.8 kg 102.2 kg    History of present illness:  On 09/24/2019 by Dr. Royce Macadamia Agbata Joseph Roberson is a 74 y.o. male with medical history significant for hypertension, paroxysmal atrial fibrillation, chronic kidney disease stage III who presents to the emergency room for evaluation of left-sided, constant, dull, pain over the left anterior chest wall which patient has had for about 24 hours.  He denies having any aggravating or relieving factors.  He denies any associated nausea, vomiting, diaphoresis or palpitations. He denies having any abdominal pain, no changes in his bowel habits, no fever, no cough, no chills, no dizziness or lightheadedness. Patient has an elevation in his troponin from 253 >> 612 Chest x-ray showed no acute findings Twelve-lead EKG shows A. fib with a incomplete right bundle branch block  Hospital Course:  Acute NSTEMI -Patient presented for evaluation of chest pain and had elevated troponin-peaked at 612 -He was started on heparin drip -Cardiology consulted and appreciated, status post heart catheterization.  Subtotal  occlusion involving terminal branch of the RI, possibly in the setting of embolization from A. fib.  Medical management was recommended -Cardiology recommended aspirin, Eliquis, statin, metoprolol -He is to follow-up with cardiology in 1 to 2 weeks  Essential hypertension -BP not at goal but has improved -Continue metoprolol and Cozaar-of note Cozaar increased, metoprolol dose cannot be increased as patient has had some bradycardic episodes  Chronic kidney disease, stage IIIb -Creatinine appears to be stable  Paroxysmal atrial fibrillation -Currently rate controlled, continue metoprolol -Continue Eliquis  Abnormal TSH -TSH 5.347 -Free T4 pending and can be followed up on by PCP -Would recommend having repeat labs in 4 to 6 weeks  Dyslipidemia -Patient tells me that he does not want to take statin medications due to the side effects -Continue fish oil -Discussed with cardiology and/or PCP-he may need PCSK9 inhibitor  Procedures: Heart catheterization  Consultations: Cardiology  Discharge Exam: Vitals:   09/25/19 0919 09/25/19 1205  BP: (!) 153/76 (!) 147/77  Pulse:  (!) 46  Resp:  18  Temp:  98.1 F (36.7 C)  SpO2:  99%     General: Well developed, well nourished, NAD, appears stated age  HEENT: NCAT, mucous membranes moist.  Cardiovascular: S1 S2 auscultated, irregular  Respiratory: Clear to auscultation bilaterally   Abdomen: Soft, nontender, nondistended, + bowel sounds  Extremities: warm dry without cyanosis clubbing or edema  Neuro: AAOx3, nonfocal  Psych: Appropriate mood and affect, pleasant   Discharge Instructions Discharge Instructions    AMB Referral to Cardiac Rehabilitation - Phase II   Complete by: As directed    Diagnosis: NSTEMI   After initial evaluation and assessments completed: Virtual Based Care may be provided alone or in  conjunction with Phase 2 Cardiac Rehab based on patient barriers.: Yes   Discharge instructions   Complete by:  As directed    Patient will be discharged to home.  Patient will need to follow up with primary care provider within one week of discharge.  Should have thyroid studies followed up on.  Follow-up with cardiology in 1 to 2 weeks.  Patient should continue medications as prescribed.  Patient should follow a heart healthy diet.     Allergies as of 09/25/2019      Reactions   Cardizem [diltiazem Hcl]    Crestor [rosuvastatin]    Vytorin [ezetimibe-simvastatin]    Zocor [simvastatin]       Medication List    TAKE these medications   apixaban 5 MG Tabs tablet Commonly known as: ELIQUIS Take 1 tablet (5 mg total) by mouth 2 (two) times daily.   aspirin EC 81 MG EC tablet Generic drug: aspirin Take 81 mg by mouth daily. Swallow whole.   atorvastatin 20 MG tablet Commonly known as: LIPITOR Take 1 tablet (20 mg total) by mouth daily. Start taking on: September 26, 2019   losartan 100 MG tablet Commonly known as: COZAAR Take 1 tablet (100 mg total) by mouth daily. Start taking on: September 26, 2019 What changed:   medication strength  how much to take   metoprolol succinate 25 MG 24 hr tablet Commonly known as: TOPROL-XL Take 25 mg by mouth daily.   omega-3 acid ethyl esters 1 g capsule Commonly known as: LOVAZA Take 1 capsule (1 g total) by mouth 2 (two) times daily.      Allergies  Allergen Reactions  . Cardizem [Diltiazem Hcl]   . Crestor [Rosuvastatin]   . Vytorin [Ezetimibe-Simvastatin]   . Zocor [Simvastatin]     Follow-up Information    Derinda Late, MD. Schedule an appointment as soon as possible for a visit in 1 week(s).   Specialty: Family Medicine Why: Hospital follow up Contact information: [address] [facility] [place] [address] [phone]        Nelva Bush, MD. Schedule an appointment as soon as possible for a visit in 1 week(s).   Specialty: Cardiology Why: Hospital follow up Contact  information: [place] [address] [phone]                The results of significant diagnostics from this hospitalization (including imaging, microbiology, ancillary and laboratory) are listed below for reference.    Significant Diagnostic Studies: DG Chest 2 View  Result Date: 09/24/2019 CLINICAL DATA:  74 year old male with persistent left side nonradiating chest pain. EXAM: CHEST - 2 VIEW COMPARISON:  Chest CT and radiographs 04/02/2005. FINDINGS: Lung volumes and mediastinal contours remain normal. Visualized tracheal air column is within normal limits. Both lungs appear clear. No pneumothorax or pleural effusion. Negative visible bowel gas pattern and osseous structures. IMPRESSION: Negative.  No acute cardiopulmonary abnormality. Electronically Signed   By: Genevie Ann M.D.   On: 09/24/2019 08:40   CARDIAC CATHETERIZATION  Result Date: 09/24/2019  Dist LAD lesion is 70% stenosed.  Mid LAD lesion is 30% stenosed.  Ost LM lesion is 20% stenosed.  Prox RCA lesion is 30% stenosed.  Mid RCA lesion is 30% stenosed.  Dist RCA lesion is 30% stenosed.  Ramus lesion is 99% stenosed.  1.  Subtotal occlusion involving a terminal branch of ramus intermedius is the likely culprit for small non-STEMI.  This is likely due to embolization from atrial fibrillation.  There is 70% stenosis in the distal LAD close to the apex and no evidence of other obstructive disease. 2.  Left ventricular angiography was not performed due to chronic kidney disease.  EF was normal by echo.  High normal LVEDP at 12 to 13 mmHg. Recommendations: Recommend medical therapy and anticoagulation. Resume heparin 6 hours after sheath pull.  Start Eliquis tomorrow morning as long as no bleeding issues. I am going to add small dose atorvastatin to see if he can tolerate this.   ECHOCARDIOGRAM COMPLETE  Result Date: 09/24/2019    ECHOCARDIOGRAM REPORT   Patient Name:   Joseph Roberson Date of  Exam: 09/24/2019 Medical Rec #:  [id number]         Height:       74.0 in Accession #:    [id number]        Weight:       230.0 lb Date of Birth:  1945/04/11         BSA:          2.306 m Patient Age:    20 years          BP:           157/97 mmHg Patient Gender: M                 HR:           54 bpm. Exam Location:  [facility] Procedure: 2D Echo, Cardiac Doppler and Color Doppler Indications:     acute myocardial infarction 410  History:         Patient has no prior history of Echocardiogram examinations.                  Risk Factors:Hypertension and Dyslipidemia. PAF.  Sonographer:     Sherrie Sport RDCS (AE) Referring Phys:  [place] Diagnosing Phys: Kathlyn Sacramento MD IMPRESSIONS  1. Left ventricular ejection fraction, by estimation, is 55 to 60%. The left ventricle has normal function. The left ventricle has no regional wall motion abnormalities. There is mild left ventricular hypertrophy. Left ventricular diastolic parameters are indeterminate.  2. Right ventricular systolic function is normal. The right ventricular size is normal. Tricuspid regurgitation signal is inadequate for assessing PA pressure.  3. Left atrial size was moderately dilated.  4. Right atrial size was moderately dilated.  5. The mitral valve is normal in structure. No evidence of mitral valve regurgitation. No evidence of mitral stenosis.  6. The aortic valve is normal in structure. Aortic valve regurgitation is not visualized. No aortic stenosis is present. FINDINGS  Left Ventricle: Left ventricular ejection fraction, by estimation, is 55 to 60%. The left ventricle has normal function. The left ventricle has no regional wall motion abnormalities. The left ventricular internal cavity size was normal in size. There is  mild left ventricular hypertrophy. Left ventricular diastolic parameters are indeterminate. Right Ventricle: The right ventricular size is normal. No increase in right ventricular wall thickness. Right ventricular systolic  function is normal. Tricuspid regurgitation signal is inadequate for assessing PA pressure. The tricuspid regurgitant velocity is 1.94 m/s, and with an assumed right atrial pressure of 10 mmHg, the estimated right ventricular systolic pressure is 31.4 mmHg. Left Atrium: Left atrial size was moderately dilated. Right Atrium: Right atrial size was moderately dilated. Pericardium: There is no evidence of pericardial effusion. Mitral Valve: The mitral valve is normal in structure. Normal mobility of  the mitral valve leaflets. No evidence of mitral valve regurgitation. No evidence of mitral valve stenosis. Tricuspid Valve: The tricuspid valve is normal in structure. Tricuspid valve regurgitation is trivial. No evidence of tricuspid stenosis. Aortic Valve: The aortic valve is normal in structure. Aortic valve regurgitation is not visualized. No aortic stenosis is present. Aortic valve mean gradient measures 3.5 mmHg. Aortic valve peak gradient measures 6.2 mmHg. Aortic valve area, by VTI measures 3.26 cm. Pulmonic Valve: The pulmonic valve was normal in structure. Pulmonic valve regurgitation is not visualized. No evidence of pulmonic stenosis. Aorta: The aortic root is normal in size and structure. Venous: The inferior vena cava was not well visualized. IAS/Shunts: No atrial level shunt detected by color flow Doppler.  LEFT VENTRICLE PLAX 2D LVIDd:         4.52 cm LVIDs:         2.48 cm LV PW:         1.15 cm LV IVS:        1.36 cm LVOT diam:     2.20 cm LV SV:         74 LV SV Index:   32 LVOT Area:     3.80 cm  RIGHT VENTRICLE RV Basal diam:  3.27 cm RV S prime:     11.30 cm/s TAPSE (M-mode): 3.4 cm LEFT ATRIUM              Index       RIGHT ATRIUM           Index LA diam:        4.60 cm  1.99 cm/m  RA Area:     28.00 cm LA Vol (A2C):   94.3 ml  40.88 ml/m RA Volume:   86.70 ml  37.59 ml/m LA Vol (A4C):   119.0 ml 51.59 ml/m LA Biplane Vol: 111.0 ml 48.13 ml/m  AORTIC VALVE                   PULMONIC VALVE AV  Area (Vmax):    2.65 cm    PV Vmax:        0.74 m/s AV Area (Vmean):   2.61 cm    PV Peak grad:   2.2 mmHg AV Area (VTI):     3.26 cm    RVOT Peak grad: 5 mmHg AV Vmax:           124.00 cm/s AV Vmean:          89.150 cm/s AV VTI:            0.228 m AV Peak Grad:      6.2 mmHg AV Mean Grad:      3.5 mmHg LVOT Vmax:         86.50 cm/s LVOT Vmean:        61.300 cm/s LVOT VTI:          0.195 m LVOT/AV VTI ratio: 0.86  AORTA Ao Root diam: 3.80 cm MITRAL VALVE               TRICUSPID VALVE MV Area (PHT): 3.51 cm    TR Peak grad:   15.1 mmHg MV Decel Time: 216 msec    TR Vmax:        194.00 cm/s MV E velocity: 82.90 cm/s                            SHUNTS  Systemic VTI:  0.20 m                            Systemic Diam: 2.20 cm Kathlyn Sacramento MD Electronically signed by Kathlyn Sacramento MD Signature Date/Time: 09/24/2019/2:45:33 PM    Final     Microbiology: Recent Results (from the past 240 hour(s))  SARS Coronavirus 2 by RT PCR (hospital order, performed in [facility] hospital lab) Nasopharyngeal Nasopharyngeal Swab     Status: None   Collection Time: 09/24/19 10:16 AM   Specimen: Nasopharyngeal Swab  Result Value Ref Range Status   SARS Coronavirus 2 NEGATIVE NEGATIVE Final    Comment: (NOTE) SARS-CoV-2 target nucleic acids are NOT DETECTED.  The SARS-CoV-2 RNA is generally detectable in upper and lower respiratory specimens during the acute phase of infection. The lowest concentration of SARS-CoV-2 viral copies this assay can detect is 250 copies / mL. A negative result does not preclude SARS-CoV-2 infection and should not be used as the sole basis for treatment or other patient management decisions.  A negative result may occur with improper specimen collection / handling, submission of specimen other than nasopharyngeal swab, presence of viral mutation(s) within the areas targeted by this assay, and inadequate number of viral copies (<250 copies / mL). A negative result  must be combined with clinical observations, patient history, and epidemiological information.  Fact Sheet for Patients:   StrictlyIdeas.no  Fact Sheet for Healthcare Providers: BankingDealers.co.za  This test is not yet approved or  cleared by the [place] FDA and has been authorized for detection and/or diagnosis of SARS-CoV-2 by FDA under an Emergency Use Authorization (EUA).  This EUA will remain in effect (meaning this test can be used) for the duration of the COVID-19 declaration under Section 564(b)(1) of the Act, 21 U.S.C. section 360bbb-3(b)(1), unless the authorization is terminated or revoked sooner.  Performed at [facility], [address] [place], [place] [address]      Labs: Basic Metabolic Panel: Recent Labs  Lab 09/24/19 0820 09/25/19 0657  NA 138 140  K 4.4 4.3  CL 103 107  CO2 25 25  GLUCOSE 109* 102*  BUN 23 19  CREATININE 1.65* 1.49*  CALCIUM 9.4 9.1  MG 1.9  --    Liver Function Tests: No results for input(s): AST, ALT, ALKPHOS, BILITOT, PROT, ALBUMIN in the last 168 hours. No results for input(s): LIPASE, AMYLASE in the last 168 hours. No results for input(s): AMMONIA in the last 168 hours. CBC: Recent Labs  Lab 09/24/19 0820 09/25/19 0657  WBC 7.1 6.0  HGB 15.4 15.7  HCT 44.6 46.5  MCV 79.4* 81.4  PLT 205 199   Cardiac Enzymes: No results for input(s): CKTOTAL, CKMB, CKMBINDEX, TROPONINI in the last 168 hours. BNP: BNP (last 3 results) No results for input(s): BNP in the last 8760 hours.  ProBNP (last 3 results) No results for input(s): PROBNP in the last 8760 hours.  CBG: No results for input(s): GLUCAP in the last 168 hours.     Signed:  Cristal Ford  [facility] 09/25/2019, 12:12 PM

## 2019-09-25 NOTE — Discharge Instructions (Signed)

## 2019-09-25 NOTE — Progress Notes (Addendum)
Progress Note  Patient Name: Joseph Roberson Date of Encounter: 09/25/2019  Primary Cardiologist: New to [facility] - consult by End  Subjective   No chest pain, dyspnea, palpitations, dizziness, presyncope or syncope. He has ambulated in his room without issues. Some bruising of the right cardiac cath site is noted. Post cath labs stable. BP in the 161W-960A systolic.   Inpatient Medications    Scheduled Meds: . acetaminophen  650 mg Oral Once  . aspirin EC  81 mg Oral Daily  . aspirin  300 mg Rectal NOW  . atorvastatin  20 mg Oral Daily  . losartan  50 mg Oral Daily  . metoprolol succinate  25 mg Oral Daily  . omega-3 acid ethyl esters  1 g Oral BID  . sodium chloride flush  3 mL Intravenous Q12H   Continuous Infusions: . sodium chloride    . heparin 1,400 Units/hr (09/24/19 2303)  . nitroGLYCERIN 7.5 mcg/min (09/24/19 1158)   PRN Meds: sodium chloride, acetaminophen, nitroGLYCERIN, nitroGLYCERIN, ondansetron (ZOFRAN) IV, sodium chloride flush   Vital Signs    Vitals:   09/24/19 1700 09/24/19 1807 09/24/19 2050 09/25/19 0623  BP: (!) 157/96 (!) 164/87 (!) 168/100 (!) 156/92  Pulse: 69 67 63 (!) 52  Resp: (!) 21 19 16 17   Temp:  98.2 F (36.8 C) 97.9 F (36.6 C) (!) 97.3 F (36.3 C)  TempSrc:  Oral Oral Oral  SpO2: 97% 98% 99% 100%  Weight:  103.8 kg    Height:  6\' 2"  (1.88 m)      Intake/Output Summary (Last 24 hours) at 09/25/2019 0635 Last data filed at 09/25/2019 5409 Gross per 24 hour  Intake 82.89 ml  Output 2375 ml  Net -2292.11 ml   Filed Weights   09/24/19 0818 09/24/19 1435 09/24/19 1807  Weight: 104.3 kg 104.3 kg 103.8 kg    Telemetry    Afib, 50s to 60s bpm - Personally Reviewed  ECG    No new tracings - Personally Reviewed  Physical Exam   GEN: No acute distress.   Neck: No JVD. Cardiac: Irregularly irregular, no murmurs, rubs, or gallops. Right radial cardiac cath site with mild soft bruising without active bleeding, warmth, or  erythema. Radial pulse 2+.  Respiratory: Clear to auscultation bilaterally.  GI: Soft, nontender, non-distended.   MS: No edema; No deformity. Neuro:  Alert and oriented x 3; Nonfocal.  Psych: Normal affect.  Labs    Chemistry Recent Labs  Lab 09/24/19 0820  NA 138  K 4.4  CL 103  CO2 25  GLUCOSE 109*  BUN 23  CREATININE 1.65*  CALCIUM 9.4  GFRNONAA 40*  GFRAA 46*  ANIONGAP 10     Hematology Recent Labs  Lab 09/24/19 0820  WBC 7.1  RBC 5.62  HGB 15.4  HCT 44.6  MCV 79.4*  MCH 27.4  MCHC 34.5  RDW 13.4  PLT 205    Cardiac EnzymesNo results for input(s): TROPONINI in the last 168 hours. No results for input(s): TROPIPOC in the last 168 hours.   BNPNo results for input(s): BNP, PROBNP in the last 168 hours.   DDimer No results for input(s): DDIMER in the last 168 hours.   Radiology    DG Chest 2 View  Result Date: 09/24/2019 IMPRESSION: Negative.  No acute cardiopulmonary abnormality. Electronically Signed   By: Genevie Ann M.D.   On: 09/24/2019 08:40    Cardiac Studies   2D echo 09/24/2019: 1. Left ventricular ejection fraction,  by estimation, is 55 to 60%. The  left ventricle has normal function. The left ventricle has no regional  wall motion abnormalities. There is mild left ventricular hypertrophy.  Left ventricular diastolic parameters  are indeterminate.  2. Right ventricular systolic function is normal. The right ventricular  size is normal. Tricuspid regurgitation signal is inadequate for assessing  PA pressure.  3. Left atrial size was moderately dilated.  4. Right atrial size was moderately dilated.  5. The mitral valve is normal in structure. No evidence of mitral valve  regurgitation. No evidence of mitral stenosis.  6. The aortic valve is normal in structure. Aortic valve regurgitation is  not visualized. No aortic stenosis is present. __________  [facility] 09/24/2019:  Dist LAD lesion is 70% stenosed.  Mid LAD lesion is 30%  stenosed.  Ost LM lesion is 20% stenosed.  Prox RCA lesion is 30% stenosed.  Mid RCA lesion is 30% stenosed.  Dist RCA lesion is 30% stenosed.  Ramus lesion is 99% stenosed.   1.  Subtotal occlusion involving a terminal branch of ramus intermedius is the likely culprit for small non-STEMI.  This is likely due to embolization from atrial fibrillation.  There is 70% stenosis in the distal LAD close to the apex and no evidence of other obstructive disease. 2.  Left ventricular angiography was not performed due to chronic kidney disease.  EF was normal by echo.  High normal LVEDP at 12 to 13 mmHg.  Recommendations: Recommend medical therapy and anticoagulation. Resume heparin 6 hours after sheath pull.  Start Eliquis tomorrow morning as long as no bleeding issues. I am going to add small dose atorvastatin to see if he can tolerate this.   Patient Profile     74 y.o. male with history of PAF on ASA only followed by PCP, PMR, CKD stage III, HTN, and HLD who is being seen today for the evaluation of NSTEMI.  Assessment & Plan    1. NSTEMI: -Currently without chest pain -Initial troponin 253 with delta 612 -LHC with subtotal occlusion involving a terminal branch of the RI, possibly in the setting of embolization from Afib -Medical management  -ASA with Eliquis for now, reassess in the office -Lipitor -Toprol XL -Post cath instructions -Cardiac rehab -[place] for discharge from our perspective -I will send a message to our office for follow up in 7-14 days  2. Afib: -Uncertain chronicity  -Ventricular rate well controlled -Toprol XL -CHADS2VASc at least 3 (HTN, age x 2) currently -Stop heparin gtt -Start Eliquis 5 mg bid -Will need DOAC at time of discharge -TSH elevated, add on free T4, needs follow up with his PCP as an outpatient  -Potassium at goal  3. CKD stage III: -Gentle IV hydration pre and post cath -Monitor  4. HTN: -Blood pressure remains elevated in the 482L  to 078M systolic -Toprol XL, bradycardic rates preclude titration at this time -Titrate losartan to 100 mg daily  5. HLD: -LDL 161, goal < 70 -Intolerant to statins -Trial of Lipitor -May need PCSK-9 inhibitor in follow up  For questions or updates, please contact [facility] Please consult [email or web address] for contact info under Cardiology/STEMI.    Signed, Christell Faith, PA-C [place] Pager: [phone] 09/25/2019, 6:35 AM

## 2019-10-03 ENCOUNTER — Other Ambulatory Visit: Payer: Self-pay

## 2019-10-03 ENCOUNTER — Encounter: Payer: Self-pay | Admitting: Family

## 2019-10-03 ENCOUNTER — Ambulatory Visit: Payer: Medicare HMO | Admitting: Family

## 2019-10-03 VITALS — BP 140/80 | HR 64 | Ht 74.0 in | Wt 228.5 lb

## 2019-10-03 DIAGNOSIS — I25118 Atherosclerotic heart disease of native coronary artery with other forms of angina pectoris: Secondary | ICD-10-CM | POA: Diagnosis not present

## 2019-10-03 DIAGNOSIS — E782 Mixed hyperlipidemia: Secondary | ICD-10-CM | POA: Diagnosis not present

## 2019-10-03 DIAGNOSIS — E785 Hyperlipidemia, unspecified: Secondary | ICD-10-CM | POA: Diagnosis not present

## 2019-10-03 DIAGNOSIS — Z7901 Long term (current) use of anticoagulants: Secondary | ICD-10-CM | POA: Diagnosis not present

## 2019-10-03 DIAGNOSIS — I10 Essential (primary) hypertension: Secondary | ICD-10-CM | POA: Diagnosis not present

## 2019-10-03 DIAGNOSIS — I4819 Other persistent atrial fibrillation: Secondary | ICD-10-CM | POA: Diagnosis not present

## 2019-10-03 MED ORDER — ATORVASTATIN CALCIUM 20 MG PO TABS: 20.0000 mg | ORAL_TABLET | Freq: Every day | ORAL | 2 refills | Status: DC

## 2019-10-03 MED ORDER — LOSARTAN POTASSIUM 100 MG PO TABS: 100.0000 mg | ORAL_TABLET | Freq: Every day | ORAL | 1 refills | Status: DC

## 2019-10-03 MED ORDER — APIXABAN 5 MG PO TABS: 5.0000 mg | ORAL_TABLET | Freq: Two times a day (BID) | ORAL | 3 refills | Status: DC

## 2019-10-03 NOTE — Progress Notes (Signed)
Office Visit    Patient Name: Joseph Roberson Date of Encounter: 10/04/2019  Primary Care Provider:  Derinda Late, MD Primary Cardiologist:  Nelva Bush, MD Electrophysiologist:  None   Chief Complaint    Joseph Roberson is a 74 y.o. male with a hx of atrial fibrillation, CKD 3, HTN, HLD, CAD, NSTEMI 09/24/19  presents today for hospital follow-up  Past Medical History    Past Medical History:  Diagnosis Date  . Arthritis   . CKD (chronic kidney disease), stage III   . Dysrhythmia   . Erectile dysfunction   . Hypercholesteremia   . Hyperlipidemia   . Hypertension   . PAF (paroxysmal atrial fibrillation) ([place])    Past Surgical History:  Procedure Laterality Date  . COLONOSCOPY WITH PROPOFOL N/A 03/07/2015   Procedure: COLONOSCOPY WITH PROPOFOL;  Surgeon: Manya Silvas, MD;  Location: [facility] ENDOSCOPY;  Service: Endoscopy;  Laterality: N/A;  . KNEE ARTHROSCOPY    . LEFT HEART CATH AND CORONARY ANGIOGRAPHY N/A 09/24/2019   Procedure: LEFT HEART CATH AND CORONARY ANGIOGRAPHY;  Surgeon: Wellington Hampshire, MD;  Location: [facility] LAB;  Service: Cardiovascular;  Laterality: N/A;  . MENISCUS REPAIR      Allergies  Allergies  Allergen Reactions  . Cardizem [Diltiazem Hcl]   . Crestor [Rosuvastatin]   . Vytorin [Ezetimibe-Simvastatin]   . Zocor [Simvastatin]     History of Present Illness    [name] is a 74 y.o. male with a hx of atrial fibrillation, CKD 3, HTN, HLD, CAD, NSTEMI 09/24/19 last seen while hospitalized.  Remote cardiac catheterization 1985 with no significant disease.  Presented to [facility] ED 09/16/2018 with chest pain.  Diagnosed with small NSTEMI.  Underwent cardiac catheterization 09/24/19 showing subtotal occlusion involving terminal branch of ramus intermedius likely culprit for small NSTEMI likely embolization from atrial fib. Noted 70% stenosis in distal LAD near apex and no other obstructive disease. Recommended for medical  therapy and anticoagulation. Echo 09/24/19 with LVEF 55-60%, mild LVH, RV normal size/function, bilateral atria moderately dilated, no significant valvular disease.   Reports feeling overall well since discharge.  He is interested in getting back to his regular level of activity.  He reports he regularly bikes up to 60 miles, walks 6 miles today, and lives a very active lifestyle.  Reports his blood pressure has been running down to normal since he has been home in the hospital.  Blood pressure yesterday at home 132/84.  We will continue monitor only discussed blood pressure goal less than 130/80.  Reviewed hospitalization findings as well as his medications.  Tells me that Eliquis at the pharmacy cost $40 which is reasonable for him.  We discussed the importance of anticoagulation in setting of atrial fibrillation.  He denies palpitations, lightheadedness, dizziness, shortness of breath, chest pain, pressure, tightness.  EKGs/Labs/Other Studies Reviewed:   The following studies were reviewed today:  Cardiac cath 09/24/19  Dist LAD lesion is 70% stenosed.  Mid LAD lesion is 30% stenosed.  Ost LM lesion is 20% stenosed.  Prox RCA lesion is 30% stenosed.  Mid RCA lesion is 30% stenosed.  Dist RCA lesion is 30% stenosed.  Ramus lesion is 99% stenosed.   1.  Subtotal occlusion involving a terminal branch of ramus intermedius is the likely culprit for small non-STEMI.  This is likely due to embolization from atrial fibrillation.  There is 70% stenosis in the distal LAD close to the apex and no evidence of other  obstructive disease. 2.  Left ventricular angiography was not performed due to chronic kidney disease.  EF was normal by echo.  High normal LVEDP at 12 to 13 mmHg.   Recommendations: Recommend medical therapy and anticoagulation. Resume heparin 6 hours after sheath pull.  Start Eliquis tomorrow morning as long as no bleeding issues. I am going to add small dose atorvastatin to see if  he can tolerate this.    Echo 09/24/19  1. Left ventricular ejection fraction, by estimation, is 55 to 60%. The  left ventricle has normal function. The left ventricle has no regional  wall motion abnormalities. There is mild left ventricular hypertrophy.  Left ventricular diastolic parameters  are indeterminate.   2. Right ventricular systolic function is normal. The right ventricular  size is normal. Tricuspid regurgitation signal is inadequate for assessing  PA pressure.   3. Left atrial size was moderately dilated.   4. Right atrial size was moderately dilated.   5. The mitral valve is normal in structure. No evidence of mitral valve  regurgitation. No evidence of mitral stenosis.   6. The aortic valve is normal in structure. Aortic valve regurgitation is  not visualized. No aortic stenosis is present.   EKG:  EKG is ordered today.  The ekg ordered today demonstrates rate controlled atrial fibrillation 64 bpm   Recent Labs: 09/24/2019: Magnesium 1.9; TSH 5.347 09/25/2019: BUN 19; Creatinine, Ser 1.49; Hemoglobin 15.7; Platelets 199; Potassium 4.3; Sodium 140  Recent Lipid Panel    Component Value Date/Time   CHOL 226 (H) 09/25/2019 0657   TRIG 126 09/25/2019 0657   HDL 40 (L) 09/25/2019 0657   CHOLHDL 5.7 09/25/2019 0657   VLDL 25 09/25/2019 0657   LDLCALC 161 (H) 09/25/2019 0657    Home Medications   Current Meds  Medication Sig  . apixaban (ELIQUIS) 5 MG TABS tablet Take 1 tablet (5 mg total) by mouth 2 (two) times daily.  Marland Kitchen aspirin (ASPIRIN EC) 81 MG EC tablet Take 81 mg by mouth daily. Swallow whole.   Marland Kitchen atorvastatin (LIPITOR) 20 MG tablet Take 1 tablet (20 mg total) by mouth daily.  Marland Kitchen losartan (COZAAR) 100 MG tablet Take 1 tablet (100 mg total) by mouth daily.  . metoprolol succinate (TOPROL-XL) 25 MG 24 hr tablet Take 25 mg by mouth daily.  Marland Kitchen omega-3 acid ethyl esters (LOVAZA) 1 g capsule Take 1 capsule (1 g total) by mouth 2 (two) times daily.    Review of  Systems       Review of Systems  Constitutional: Negative for chills, fever and malaise/fatigue.  Cardiovascular: Negative for chest pain, dyspnea on exertion, irregular heartbeat, leg swelling, near-syncope, orthopnea, palpitations and syncope.  Respiratory: Negative for cough, shortness of breath and wheezing.   Gastrointestinal: Negative for melena, nausea and vomiting.  Genitourinary: Negative for hematuria.  Neurological: Negative for dizziness, light-headedness and weakness.   All other systems reviewed and are otherwise negative except as noted above.  Physical Exam    VS:  BP 140/80 (BP Location: Left Arm, Patient Position: Sitting, Cuff Size: Normal)   Pulse 64   Ht 6\' 2"  (1.88 m)   Wt 228 lb 8 oz (103.6 kg)   SpO2 98%   BMI 29.34 kg/m  , BMI Body mass index is 29.34 kg/m. GEN: Well nourished, well developed, in no acute distress. HEENT: normal. Neck: Supple, no JVD, carotid bruits, or masses. Cardiac: irregularly irregular, no murmurs, rubs, or gallops. No clubbing, cyanosis, edema.  Radials/DP/PT 2+  and equal bilaterally.  Respiratory:  Respirations regular and unlabored, clear to auscultation bilaterally. GI: Soft, nontender, nondistended, BS + x 4. MS: No deformity or atrophy. Skin: Warm and dry, no rash. R radial cath site healing well with no hematoma, erythema, signs of infection.  Neuro:  Strength and sensation are intact. Psych: Normal affect.  Assessment & Plan    1. CAD - No anginal symptoms.  Recent small NSTEMI due to embolization atrial fibrillation.  Cardiac cath with recommendation for medical management and anticoagulation.  GDMT includes aspirin, beta-blocker, statin.  Low-sodium, healthy diet encouraged.  Regular cardiovascular activity encouraged.  His catheterization site is healing appropriately with no signs or symptoms of infection, hematoma.  2. HLD, LDL goal < 70 - Started on Atorvastatin 20mg  daily while hospitalized, tolerating well. He has  previously been intolerant of Simvastatin, Rosuvastatin, Vytorin. Lipid panel 09/25/19 with LDL 161. Will need repeat lipid/liver in 6-8 weeks. Plans to have done with his PCP at upcoming [place] in August. Likely will need further escalation of lipid-lowering therapy. Consider Nexletol, Nexlizet, PCSK9i.   3. HTN -BP mildly elevated in clinic today but at home routinely 132/80.  Discussed blood pressure goal of less than 130/80.  Continue present antihypertensive regimen of losartan 100 mg daily, Toprol 25 mg daily.  If BP remains elevated could consider transition to Coreg in the future versus addition of low-dose amlodipine.  4. Persistent atrial fibrillation/chronic anticoagulation - Rate controlled by EKG today. Continue Metoprolol. He is asymptomatic with no palpitations, shortness of breath, fatigue. Chronic anticoagulation due to CHADS2VASc of at least 4 (agex2, HTN, CAD). Tolerating Eliquis 5mg  BID - does not meet dose reduction criteria. Low dose aspirin continued in setting of NSTEMI - denies bleeding complications. Reviewed signs and symptoms of bleeding to report. Plans to have CBC at upcoming [facility] in August with PCP.   Disposition: Follow up in 2 month(s) with Dr. Saunders Revel or APP   Loel Dubonnet, NP 10/04/2019, 8:42 AM

## 2019-10-03 NOTE — Patient Instructions (Addendum)
Medication Instructions:  No medication changes today.   We have refilled your Losartan, Atorvastatin, Eliquis.   *If you need a refill on your cardiac medications before your next appointment, please call your pharmacy*  Lab Work: No lab work today. Recommend you have your kidneys rechecked with Dr. Baldemar Lenis. Your thyroid numbers were a bit off during hospitalization, recommend you discuss with Dr. Loney Hering.   If you have labs (blood work) drawn today and your tests are completely normal, you will receive your results only by: Marland Kitchen MyChart Message (if you have MyChart) OR . A paper copy in the mail If you have any lab test that is abnormal or we need to change your treatment, we will call you to review the results.  Testing/Procedures: Your EKG today shows rate controlled atrial fibrillation.  Follow-Up: At [facility], you and your health needs are our priority.  As part of our continuing mission to provide you with exceptional heart care, we have created designated Provider Care Teams.  These Care Teams include your primary Cardiologist (physician) and Advanced Practice Providers (APPs -  Physician Assistants and Nurse Practitioners) who all work together to provide you with the care you need, when you need it.  We recommend signing up for the patient portal called "MyChart".  Sign up information is provided on this After Visit Summary.  MyChart is used to connect with patients for Virtual Visits (Telemedicine).  Patients are able to view lab/test results, encounter notes, upcoming appointments, etc.  Non-urgent messages can be sent to your provider as well.   To learn more about what you can do with MyChart, go to [email or web address].    Your next appointment:   2 month(s)  The format for your next appointment:   In Person  Provider:   You may see Nelva Bush, MD or one of the following Advanced Practice Providers on your designated Care Team:    Murray Hodgkins,  NP  Christell Faith, PA-C  Marrianne Mood, PA-C  Laurann Montana, NP  Other Instructions  Fat and Cholesterol Restricted Eating Plan Getting too much fat and cholesterol in your diet may cause health problems. Choosing the right foods helps keep your fat and cholesterol at normal levels. This can keep you from getting certain diseases. What are tips for following this plan? Meal planning  At meals, divide your plate into four equal parts: ? Fill one-half of your plate with vegetables and green salads. ? Fill one-fourth of your plate with whole grains. ? Fill one-fourth of your plate with low-fat (lean) protein foods.  Eat fish that is high in omega-3 fats at least two times a week. This includes mackerel, tuna, sardines, and salmon.  Eat foods that are high in fiber, such as whole grains, beans, apples, broccoli, carrots, peas, and barley. General tips   Work with your doctor to lose weight if you need to.  Avoid: ? Foods with added sugar. ? Fried foods. ? Foods with partially hydrogenated oils.  Limit alcohol intake to no more than 1 drink a day for nonpregnant women and 2 drinks a day for men. One drink equals 12 oz of beer, 5 oz of wine, or 1 oz of hard liquor. Reading food labels  Check food labels for: ? Trans fats. ? Partially hydrogenated oils. ? Saturated fat (g) in each serving. ? Cholesterol (mg) in each serving. ? Fiber (g) in each serving.  Choose foods with healthy fats, such as: ? Monounsaturated fats. ? Polyunsaturated fats. ? Omega-3  fats.  Choose grain products that have whole grains. Look for the word "whole" as the first word in the ingredient list. Cooking  Cook foods using low-fat methods. These include baking, boiling, grilling, and broiling.  Eat more home-cooked foods. Eat at restaurants and buffets less often.  Avoid cooking using saturated fats, such as butter, cream, palm oil, palm kernel oil, and coconut oil. Recommended  foods  Fruits  All fresh, canned (in natural juice), or frozen fruits. Vegetables  Fresh or frozen vegetables (raw, steamed, roasted, or grilled). Green salads. Grains  Whole grains, such as whole wheat or whole grain breads, crackers, cereals, and pasta. Unsweetened oatmeal, bulgur, barley, quinoa, or brown rice. Corn or whole wheat flour tortillas. Meats and other protein foods  Ground beef (85% or leaner), grass-fed beef, or beef trimmed of fat. Skinless chicken or [place]. Ground chicken or [place]. Pork trimmed of fat. All fish and seafood. Egg whites. Dried beans, peas, or lentils. Unsalted nuts or seeds. Unsalted canned beans. Nut butters without added sugar or oil. Dairy  Low-fat or nonfat dairy products, such as skim or 1% milk, 2% or reduced-fat cheeses, low-fat and fat-free ricotta or cottage cheese, or plain low-fat and nonfat yogurt. Fats and oils  Tub margarine without trans fats. Light or reduced-fat mayonnaise and salad dressings. Avocado. Olive, canola, sesame, or safflower oils. The items listed above may not be a complete list of foods and beverages you can eat. Contact a dietitian for more information. Foods to avoid Fruits  Canned fruit in heavy syrup. Fruit in cream or butter sauce. Fried fruit. Vegetables  Vegetables cooked in cheese, cream, or butter sauce. Fried vegetables. Grains  White bread. White pasta. White rice. Cornbread. Bagels, pastries, and croissants. Crackers and snack foods that contain trans fat and hydrogenated oils. Meats and other protein foods  Fatty cuts of meat. Ribs, chicken wings, bacon, sausage, bologna, salami, chitterlings, fatback, hot dogs, bratwurst, and packaged lunch meats. Liver and organ meats. Whole eggs and egg yolks. Chicken and [place] with skin. Fried meat. Dairy  Whole or 2% milk, cream, half-and-half, and cream cheese. Whole milk cheeses. Whole-fat or sweetened yogurt. Full-fat cheeses. Nondairy creamers and whipped  toppings. Processed cheese, cheese spreads, and cheese curds. Beverages  Alcohol. Sugar-sweetened drinks such as sodas, lemonade, and fruit drinks. Fats and oils  Butter, stick margarine, lard, shortening, ghee, or bacon fat. Coconut, palm kernel, and palm oils. Sweets and desserts  Corn syrup, sugars, honey, and molasses. Candy. Jam and jelly. Syrup. Sweetened cereals. Cookies, pies, cakes, donuts, muffins, and ice cream. The items listed above may not be a complete list of foods and beverages you should avoid. Contact a dietitian for more information. Summary  Choosing the right foods helps keep your fat and cholesterol at normal levels. This can keep you from getting certain diseases.  At meals, fill one-half of your plate with vegetables and green salads.  Eat high-fiber foods, like whole grains, beans, apples, carrots, peas, and barley.  Limit added sugar, saturated fats, alcohol, and fried foods. This information is not intended to replace advice given to you by your health care provider. Make sure you discuss any questions you have with your health care provider. Document Revised: 11/16/2017 Document Reviewed: 11/30/2016 Elsevier Patient Education  [place].   Atrial Fibrillation  Atrial fibrillation is a type of heartbeat that is irregular or fast. If you have this condition, your heart beats without any order. This makes it hard for your heart to  pump blood in a normal way. Atrial fibrillation may come and go, or it may become a long-lasting problem. If this condition is not treated, it can put you at higher risk for stroke, heart failure, and other heart problems. What are the causes? This condition may be caused by diseases that damage the heart. They include:  High blood pressure.  Heart failure.  Heart valve disease.  Heart surgery. Other causes include:  Diabetes.  Thyroid disease.  Being overweight.  Kidney disease. Sometimes the cause is not  known. What increases the risk? You are more likely to develop this condition if:  You are older.  You smoke.  You exercise often and very hard.  You have a family history of this condition.  You are a man.  You use drugs.  You drink a lot of alcohol.  You have lung conditions, such as emphysema, pneumonia, or COPD.  You have sleep apnea. What are the signs or symptoms? Common symptoms of this condition include:  A feeling that your heart is beating very fast.  Chest pain or discomfort.  Feeling short of breath.  Suddenly feeling light-headed or weak.  Getting tired easily during activity.  Fainting.  Sweating. In some cases, there are no symptoms. How is this treated? Treatment for this condition depends on underlying conditions and how you feel when you have atrial fibrillation. They include:  Medicines to: ? Prevent blood clots. ? Treat heart rate or heart rhythm problems.  Using devices, such as a pacemaker, to correct heart rhythm problems.  Doing surgery to remove the part of the heart that sends bad signals.  Closing an area where clots can form in the heart (left atrial appendage). In some cases, your doctor will treat other underlying conditions. Follow these instructions at home: Medicines  Take over-the-counter and prescription medicines only as told by your doctor.  Do not take any new medicines without first talking to your doctor.  If you are taking blood thinners: ? Talk with your doctor before you take any medicines that have aspirin or NSAIDs, such as ibuprofen, in them. ? Take your medicine exactly as told by your doctor. Take it at the same time each day. ? Avoid activities that could hurt or bruise you. Follow instructions about how to prevent falls. ? Wear a bracelet that says you are taking blood thinners. Or, carry a card that lists what medicines you take. Lifestyle      Do not use any products that have nicotine or tobacco  in them. These include cigarettes, e-cigarettes, and chewing tobacco. If you need help quitting, ask your doctor.  Eat heart-healthy foods. Talk with your doctor about the right eating plan for you.  Exercise regularly as told by your doctor.  Do not drink alcohol.  Lose weight if you are overweight.  Do not use drugs, including cannabis. General instructions  If you have a condition that causes breathing to stop for a short period of time (apnea), treat it as told by your doctor.  Keep a healthy weight. Do not use diet pills unless your doctor says they are safe for you. Diet pills may make heart problems worse.  Keep all follow-up visits as told by your doctor. This is important. Contact a doctor if:  You notice a change in the speed, rhythm, or strength of your heartbeat.  You are taking a blood-thinning medicine and you get more bruising.  You get tired more easily when you move or exercise.  You have a sudden change in weight. Get help right away if:   You have pain in your chest or your belly (abdomen).  You have trouble breathing.  You have side effects of blood thinners, such as blood in your vomit, poop (stool), or pee (urine), or bleeding that cannot stop.  You have any signs of a stroke. "BE FAST" is an easy way to remember the main warning signs: ? B - Balance. Signs are dizziness, sudden trouble walking, or loss of balance. ? E - Eyes. Signs are trouble seeing or a change in how you see. ? F - Face. Signs are sudden weakness or loss of feeling in the face, or the face or eyelid drooping on one side. ? A - Arms. Signs are weakness or loss of feeling in an arm. This happens suddenly and usually on one side of the body. ? S - Speech. Signs are sudden trouble speaking, slurred speech, or trouble understanding what people say. ? T - Time. Time to call emergency services. Write down what time symptoms started.  You have other signs of a stroke, such as: ? A sudden,  very bad headache with no known cause. ? Feeling like you may vomit (nausea). ? Vomiting. ? A seizure. These symptoms may be an emergency. Do not wait to see if the symptoms will go away. Get medical help right away. Call your local emergency services (911 in the [place] Do not drive yourself to the hospital. Summary  Atrial fibrillation is a type of heartbeat that is irregular or fast.  You are at higher risk of this condition if you smoke, are older, have diabetes, or are overweight.  Follow your doctor's instructions about medicines, diet, exercise, and follow-up visits.  Get help right away if you have signs or symptoms of a stroke.  Get help right away if you cannot catch your breath, or you have chest pain or discomfort. This information is not intended to replace advice given to you by your health care provider. Make sure you discuss any questions you have with your health care provider. Document Revised: 09/06/2018 Document Reviewed: 09/06/2018 Elsevier Patient Education  [place].

## 2019-10-12 DIAGNOSIS — I10 Essential (primary) hypertension: Secondary | ICD-10-CM | POA: Diagnosis not present

## 2019-10-12 DIAGNOSIS — I480 Paroxysmal atrial fibrillation: Secondary | ICD-10-CM | POA: Diagnosis not present

## 2019-10-12 DIAGNOSIS — I252 Old myocardial infarction: Secondary | ICD-10-CM | POA: Diagnosis not present

## 2019-10-25 ENCOUNTER — Ambulatory Visit: Payer: Commercial Managed Care - HMO | Admitting: Dermatology

## 2019-10-26 ENCOUNTER — Other Ambulatory Visit: Payer: Self-pay | Admitting: Internal Medicine

## 2019-10-26 NOTE — Telephone Encounter (Signed)
Please advise if ok lovaza 1 g capsule bid. Last filled  Cristal Ford, DO

## 2019-10-29 NOTE — Telephone Encounter (Signed)
Patient can purchase over the counter fish oil and take 1000 mg BID.  Joseph Roberson

## 2019-11-19 DIAGNOSIS — R739 Hyperglycemia, unspecified: Secondary | ICD-10-CM | POA: Diagnosis not present

## 2019-11-19 DIAGNOSIS — E7800 Pure hypercholesterolemia, unspecified: Secondary | ICD-10-CM | POA: Diagnosis not present

## 2019-11-19 DIAGNOSIS — N1832 Chronic kidney disease, stage 3b: Secondary | ICD-10-CM | POA: Diagnosis not present

## 2019-11-19 DIAGNOSIS — Z125 Encounter for screening for malignant neoplasm of prostate: Secondary | ICD-10-CM | POA: Diagnosis not present

## 2019-11-19 DIAGNOSIS — Z79899 Other long term (current) drug therapy: Secondary | ICD-10-CM | POA: Diagnosis not present

## 2019-11-26 DIAGNOSIS — Z1331 Encounter for screening for depression: Secondary | ICD-10-CM | POA: Diagnosis not present

## 2019-11-26 DIAGNOSIS — Z0000 Encounter for general adult medical examination without abnormal findings: Secondary | ICD-10-CM | POA: Diagnosis not present

## 2019-12-05 ENCOUNTER — Other Ambulatory Visit: Payer: Self-pay

## 2019-12-05 ENCOUNTER — Encounter: Payer: Self-pay | Admitting: Family

## 2019-12-05 ENCOUNTER — Ambulatory Visit: Payer: Medicare HMO | Admitting: Family

## 2019-12-05 VITALS — BP 110/90 | HR 50 | Ht 74.0 in | Wt 222.2 lb

## 2019-12-05 DIAGNOSIS — I4819 Other persistent atrial fibrillation: Secondary | ICD-10-CM

## 2019-12-05 DIAGNOSIS — I25118 Atherosclerotic heart disease of native coronary artery with other forms of angina pectoris: Secondary | ICD-10-CM | POA: Diagnosis not present

## 2019-12-05 DIAGNOSIS — E785 Hyperlipidemia, unspecified: Secondary | ICD-10-CM | POA: Diagnosis not present

## 2019-12-05 DIAGNOSIS — Z7901 Long term (current) use of anticoagulants: Secondary | ICD-10-CM | POA: Diagnosis not present

## 2019-12-05 MED ORDER — METOPROLOL SUCCINATE ER 25 MG PO TB24: 12.5000 mg | ORAL_TABLET | Freq: Every day | ORAL | Status: DC

## 2019-12-05 NOTE — Progress Notes (Signed)
Office Visit    Patient Name: Joseph Roberson Date of Encounter: 12/05/2019  Primary Care Provider:  Derinda Late, MD Primary Cardiologist:  Nelva Bush, MD Electrophysiologist:  None   Chief Complaint    Joseph Roberson is a 74 y.o. male with a hx of atrial fibrillation, CKD 3, HTN, HLD, CAD, NSTEMI 09/24/19  presents today for follow-up of CAD  Past Medical History    Past Medical History:  Diagnosis Date  . Actinic keratosis   . Arthritis   . CKD (chronic kidney disease), stage III   . Dysrhythmia   . Erectile dysfunction   . Hypercholesteremia   . Hyperlipidemia   . Hypertension   . PAF (paroxysmal atrial fibrillation) ([place])    Past Surgical History:  Procedure Laterality Date  . COLONOSCOPY WITH PROPOFOL N/A 03/07/2015   Procedure: COLONOSCOPY WITH PROPOFOL;  Surgeon: Manya Silvas, MD;  Location: [facility] ENDOSCOPY;  Service: Endoscopy;  Laterality: N/A;  . KNEE ARTHROSCOPY    . LEFT HEART CATH AND CORONARY ANGIOGRAPHY N/A 09/24/2019   Procedure: LEFT HEART CATH AND CORONARY ANGIOGRAPHY;  Surgeon: Wellington Hampshire, MD;  Location: [place] [facility];  Service: Cardiovascular;  Laterality: N/A;  . MENISCUS REPAIR      Allergies  Allergies  Allergen Reactions  . Cardizem [Diltiazem Hcl]   . Crestor [Rosuvastatin]   . Vytorin [Ezetimibe-Simvastatin]   . Zocor [Simvastatin]     History of Present Illness    Joseph Roberson is a 74 y.o. male with a hx of atrial fibrillation, CKD 3, HTN, HLD, CAD, NSTEMI 09/24/19 last seen 10/03/2019 .  Remote cardiac catheterization 1985 with no significant disease.  Presented to [facility] ED 09/16/2018 with chest pain.  Diagnosed with small NSTEMI.  Underwent cardiac catheterization 09/24/19 showing subtotal occlusion involving terminal branch of ramus intermedius likely culprit for small NSTEMI likely embolization from atrial fib. Noted 70% stenosis in distal LAD near apex and no other obstructive disease. Recommended for  medical therapy and anticoagulation. Echo 09/24/19 with LVEF 55-60%, mild LVH, RV normal size/function, bilateral atria moderately dilated, no significant valvular disease.   Seen for hospital follow-up 09/28/2019.  He was doing well at that time and working his way back up to his regular walking and biking routine.  Presented for follow-up.  Tells me due to low blood pressure his primary care provider decrease his losartan from 100 mg to 50 mg.  His blood pressure has remained at goal of less than 130/80.  Reports no lightheadedness, dizziness, near-syncope, syncope.  Does note his heart rate routinely runs in the 40s and 50s.  He is very active and has had a low resting heart rate for a number of years.  In fact on Sunday he biked 35 miles.  Very much enjoys cycling.  Reports no shortness of breath nor dyspnea on exertion. Reports no chest pain, pressure, or tightness. No edema, orthopnea, PND. Reports no palpitations.  Shares with me that he is tolerating his blood thinner well with no bleeding complications.  EKGs/Labs/Other Studies Reviewed:   The following studies were reviewed today:  Cardiac cath 09/24/19  Dist LAD lesion is 70% stenosed.  Mid LAD lesion is 30% stenosed.  Ost LM lesion is 20% stenosed.  Prox RCA lesion is 30% stenosed.  Mid RCA lesion is 30% stenosed.  Dist RCA lesion is 30% stenosed.  Ramus lesion is 99% stenosed.   1.  Subtotal occlusion involving a terminal branch of ramus intermedius is  the likely culprit for small non-STEMI.  This is likely due to embolization from atrial fibrillation.  There is 70% stenosis in the distal LAD close to the apex and no evidence of other obstructive disease. 2.  Left ventricular angiography was not performed due to chronic kidney disease.  EF was normal by echo.  High normal LVEDP at 12 to 13 mmHg.   Recommendations: Recommend medical therapy and anticoagulation. Resume heparin 6 hours after sheath pull.  Start Eliquis tomorrow  morning as long as no bleeding issues. I am going to add small dose atorvastatin to see if he can tolerate this.    Echo 09/24/19  1. Left ventricular ejection fraction, by estimation, is 55 to 60%. The  left ventricle has normal function. The left ventricle has no regional  wall motion abnormalities. There is mild left ventricular hypertrophy.  Left ventricular diastolic parameters  are indeterminate.   2. Right ventricular systolic function is normal. The right ventricular  size is normal. Tricuspid regurgitation signal is inadequate for assessing  PA pressure.   3. Left atrial size was moderately dilated.   4. Right atrial size was moderately dilated.   5. The mitral valve is normal in structure. No evidence of mitral valve  regurgitation. No evidence of mitral stenosis.   6. The aortic valve is normal in structure. Aortic valve regurgitation is  not visualized. No aortic stenosis is present.   EKG:  EKG is ordered today.  The ekg ordered today demonstrates rate controlled atrial fibrillation 50 bpm with no acute ST/T wave changes.  Recent Labs: 09/24/2019: Magnesium 1.9; TSH 5.347 09/25/2019: BUN 19; Creatinine, Ser 1.49; Hemoglobin 15.7; Platelets 199; Potassium 4.3; Sodium 140  Recent Lipid Panel    Component Value Date/Time   CHOL 226 (H) 09/25/2019 0657   TRIG 126 09/25/2019 0657   HDL 40 (L) 09/25/2019 0657   CHOLHDL 5.7 09/25/2019 0657   VLDL 25 09/25/2019 0657   LDLCALC 161 (H) 09/25/2019 0657    Home Medications   Current Meds  Medication Sig  . apixaban (ELIQUIS) 5 MG TABS tablet Take 1 tablet (5 mg total) by mouth 2 (two) times daily.  Marland Kitchen aspirin (ASPIRIN EC) 81 MG EC tablet Take 81 mg by mouth daily. Swallow whole.   Marland Kitchen atorvastatin (LIPITOR) 20 MG tablet Take 1 tablet (20 mg total) by mouth daily.  Marland Kitchen losartan (COZAAR) 50 MG tablet Take 1 tablet by mouth daily.  . metoprolol succinate (TOPROL-XL) 25 MG 24 hr tablet Take 25 mg by mouth daily.  Marland Kitchen omega-3 acid  ethyl esters (LOVAZA) 1 g capsule Take 1 capsule (1 g total) by mouth 2 (two) times daily.  . [DISCONTINUED] Omega-3 Fatty Acids (FISH OIL) 1000 MG CAPS Take by mouth.    Review of Systems       Review of Systems  Constitutional: Negative for chills, fever and malaise/fatigue.  Cardiovascular: Negative for chest pain, dyspnea on exertion, irregular heartbeat, leg swelling, near-syncope, orthopnea, palpitations and syncope.  Respiratory: Negative for cough, shortness of breath and wheezing.   Gastrointestinal: Negative for melena, nausea and vomiting.  Genitourinary: Negative for hematuria.  Neurological: Negative for dizziness, light-headedness and weakness.   All other systems reviewed and are otherwise negative except as noted above.  Physical Exam    VS:  BP 110/90 (BP Location: Left Arm, Patient Position: Sitting, Cuff Size: Normal)   Pulse (!) 50   Ht 6\' 2"  (1.88 m)   Wt 222 lb 3.2 oz (100.8 kg)  SpO2 98%   BMI 28.53 kg/m  , BMI Body mass index is 28.53 kg/m. GEN: Well nourished, well developed, in no acute distress. HEENT: normal. Neck: Supple, no JVD, carotid bruits, or masses. Cardiac: irregularly irregular, no murmurs, rubs, or gallops. No clubbing, cyanosis, edema.  Radials/DP/PT 2+ and equal bilaterally.  Respiratory:  Respirations regular and unlabored, clear to auscultation bilaterally. GI: Soft, nontender, nondistended, BS + x 4. MS: No deformity or atrophy. Skin: Warm and dry, no rash. R radial cath site healing well with no hematoma, erythema, signs of infection.  Neuro:  Strength and sensation are intact. Psych: Normal affect.  Assessment & Plan    1. CAD -s/p NSTEMI 09/2019 due to embolization in the setting atrial fibrillation. Cardiac cath with recommendation for medical management and anticoagulation.  GDMT includes aspirin, beta-blocker, statin.  Low-sodium, healthy diet encouraged.  Regular cardiovascular activity encouraged.  Due to bradycardia, reduce  Toprol to 12.5 mg daily.  2. HLD, LDL goal < 70 - Started on Atorvastatin 20mg  daily while hospitalized 09/2019, tolerating well. He has previously been intolerant of Simvastatin, Rosuvastatin, Vytorin. Lipid panel 09/25/19 with LDL 161.  Repeat lipid panel/23/21 with his primary care provider with LDL 55.  Congratulated on improvement.  Tells me he has made multiple changes to his diet lifestyle.  As his triglycerides were less than 150 we discussed that he did not need to take fish oil as he has been off of it for approximately 1 month.  3. HTN - BP well controlled. Continue current antihypertensive regimen.   4. Persistent atrial fibrillation/chronic anticoagulation - Rate controlled by EKG today.  As he is bradycardic with a rate of 50 bpm will decrease Toprol to 12.5 mg daily. He is asymptomatic with no palpitations, shortness of breath, fatigue. Chronic anticoagulation due to CHADS2VASc of at least 4 (agex2, HTN, CAD). Tolerating Eliquis 5mg  BID - does not meet dose reduction criteria. Low dose aspirin continued in setting of NSTEMI - denies bleeding complications. Reviewed signs and symptoms of bleeding to report.  CBC 11/19/2019 with no evidence of anemia.  Disposition: Follow up in 6 month(s) with Dr. Saunders Revel or APP   Loel Dubonnet, NP 12/05/2019, 10:45 AM

## 2019-12-05 NOTE — Patient Instructions (Signed)
Medication Instructions:  Your physician has recommended you make the following change in your medication:   CHANGE Metoprolol to 12.5 mg (half tablet) daily  *If you need a refill on your cardiac medications before your next appointment, please call your pharmacy*   Lab Work: No lab work today. Your cholesterol numbers with Dr. Baldemar Lenis looked great!  Testing/Procedures: Your EKG today shows rate controlled atrial fibrillation.   Follow-Up: At [facility], you and your health needs are our priority.  As part of our continuing mission to provide you with exceptional heart care, we have created designated Provider Care Teams.  These Care Teams include your primary Cardiologist (physician) and Advanced Practice Providers (APPs -  Physician Assistants and Nurse Practitioners) who all work together to provide you with the care you need, when you need it.  We recommend signing up for the patient portal called "MyChart".  Sign up information is provided on this After Visit Summary.  MyChart is used to connect with patients for Virtual Visits (Telemedicine).  Patients are able to view lab/test results, encounter notes, upcoming appointments, etc.  Non-urgent messages can be sent to your provider as well.   To learn more about what you can do with MyChart, go to [email or web address].    Your next appointment:   6 month(s)  The format for your next appointment:   In Person  Provider:    You may see Nelva Bush, MD or one of the following Advanced Practice Providers on your designated Care Team:   Murray Hodgkins, NP  Christell Faith, PA-C  Laurann Montana, NP  Marrianne Mood, PA-C  Other Instructions

## 2019-12-20 ENCOUNTER — Other Ambulatory Visit: Payer: Self-pay | Admitting: Family

## 2019-12-20 DIAGNOSIS — E782 Mixed hyperlipidemia: Secondary | ICD-10-CM

## 2020-02-04 DIAGNOSIS — J019 Acute sinusitis, unspecified: Secondary | ICD-10-CM | POA: Diagnosis not present

## 2020-02-04 DIAGNOSIS — B9689 Other specified bacterial agents as the cause of diseases classified elsewhere: Secondary | ICD-10-CM | POA: Diagnosis not present

## 2020-03-31 DIAGNOSIS — Z0100 Encounter for examination of eyes and vision without abnormal findings: Secondary | ICD-10-CM | POA: Diagnosis not present

## 2020-03-31 DIAGNOSIS — I10 Essential (primary) hypertension: Secondary | ICD-10-CM | POA: Diagnosis not present

## 2020-05-16 DIAGNOSIS — H401131 Primary open-angle glaucoma, bilateral, mild stage: Secondary | ICD-10-CM | POA: Diagnosis not present

## 2020-05-29 DIAGNOSIS — N1831 Chronic kidney disease, stage 3a: Secondary | ICD-10-CM | POA: Diagnosis not present

## 2020-05-29 DIAGNOSIS — E7800 Pure hypercholesterolemia, unspecified: Secondary | ICD-10-CM | POA: Diagnosis not present

## 2020-05-29 DIAGNOSIS — Z79899 Other long term (current) drug therapy: Secondary | ICD-10-CM | POA: Diagnosis not present

## 2020-05-29 DIAGNOSIS — R739 Hyperglycemia, unspecified: Secondary | ICD-10-CM | POA: Diagnosis not present

## 2020-06-04 ENCOUNTER — Ambulatory Visit: Payer: Medicare HMO | Admitting: Internal Medicine

## 2020-06-05 DIAGNOSIS — R739 Hyperglycemia, unspecified: Secondary | ICD-10-CM | POA: Diagnosis not present

## 2020-06-05 DIAGNOSIS — I2510 Atherosclerotic heart disease of native coronary artery without angina pectoris: Secondary | ICD-10-CM | POA: Diagnosis not present

## 2020-06-05 DIAGNOSIS — Z125 Encounter for screening for malignant neoplasm of prostate: Secondary | ICD-10-CM | POA: Diagnosis not present

## 2020-06-05 DIAGNOSIS — Z79899 Other long term (current) drug therapy: Secondary | ICD-10-CM | POA: Diagnosis not present

## 2020-06-05 DIAGNOSIS — I129 Hypertensive chronic kidney disease with stage 1 through stage 4 chronic kidney disease, or unspecified chronic kidney disease: Secondary | ICD-10-CM | POA: Diagnosis not present

## 2020-06-05 DIAGNOSIS — I480 Paroxysmal atrial fibrillation: Secondary | ICD-10-CM | POA: Diagnosis not present

## 2020-06-05 DIAGNOSIS — N183 Chronic kidney disease, stage 3 unspecified: Secondary | ICD-10-CM | POA: Diagnosis not present

## 2020-07-10 ENCOUNTER — Ambulatory Visit: Payer: Medicare HMO | Admitting: Internal Medicine

## 2020-07-10 ENCOUNTER — Other Ambulatory Visit: Payer: Self-pay

## 2020-07-10 ENCOUNTER — Encounter: Payer: Self-pay | Admitting: Internal Medicine

## 2020-07-10 VITALS — BP 134/88 | HR 54 | Ht 74.0 in | Wt 237.0 lb

## 2020-07-10 DIAGNOSIS — I2510 Atherosclerotic heart disease of native coronary artery without angina pectoris: Secondary | ICD-10-CM

## 2020-07-10 DIAGNOSIS — I10 Essential (primary) hypertension: Secondary | ICD-10-CM | POA: Diagnosis not present

## 2020-07-10 DIAGNOSIS — E785 Hyperlipidemia, unspecified: Secondary | ICD-10-CM

## 2020-07-10 DIAGNOSIS — I4811 Longstanding persistent atrial fibrillation: Secondary | ICD-10-CM

## 2020-07-10 NOTE — Patient Instructions (Signed)
Medication Instructions:  Your physician has recommended you make the following change in your medication:   You may STOP aspirin at the end of June 2022.  You should continue Eliquis as prescribed.  *If you need a refill on your cardiac medications before your next appointment, please call your pharmacy*   Lab Work: None ordered If you have labs (blood work) drawn today and your tests are completely normal, you will receive your results only by: Marland Kitchen MyChart Message (if you have MyChart) OR . A paper copy in the mail If you have any lab test that is abnormal or we need to change your treatment, we will call you to review the results.   Testing/Procedures: None ordered   Follow-Up: At [facility], you and your health needs are our priority.  As part of our continuing mission to provide you with exceptional heart care, we have created designated Provider Care Teams.  These Care Teams include your primary Cardiologist (physician) and Advanced Practice Providers (APPs -  Physician Assistants and Nurse Practitioners) who all work together to provide you with the care you need, when you need it.  We recommend signing up for the patient portal called "MyChart".  Sign up information is provided on this After Visit Summary.  MyChart is used to connect with patients for Virtual Visits (Telemedicine).  Patients are able to view lab/test results, encounter notes, upcoming appointments, etc.  Non-urgent messages can be sent to your provider as well.   To learn more about what you can do with MyChart, go to [email or web address].    Your next appointment:   Your physician wants you to follow-up in: 6 months You will receive a reminder letter in the mail two months in advance. If you don't receive a letter, please call our office to schedule the follow-up appointment.   The format for your next appointment:   In Person  Provider:   You may see Nelva Bush, MD or one of the following  Advanced Practice Providers on your designated Care Team:    Murray Hodgkins, NP  Christell Faith, PA-C  Marrianne Mood, PA-C  Cadence Kathlen Mody, [place]  Laurann Montana, NP    Other Instructions Your physician discussed the importance of regular exercise and recommended that you start or continue a regular exercise program for good health.   DASH Eating Plan DASH stands for Dietary Approaches to Stop Hypertension. The DASH eating plan is a healthy eating plan that has been shown to:  Reduce high blood pressure (hypertension).  Reduce your risk for type 2 diabetes, heart disease, and stroke.  Help with weight loss. What are tips for following this plan? Reading food labels  Check food labels for the amount of salt (sodium) per serving. Choose foods with less than 5 percent of the Daily Value of sodium. Generally, foods with less than 300 milligrams (mg) of sodium per serving fit into this eating plan.  To find whole grains, look for the word "whole" as the first word in the ingredient list. Shopping  Buy products labeled as "low-sodium" or "no salt added."  Buy fresh foods. Avoid canned foods and pre-made or frozen meals. Cooking  Avoid adding salt when cooking. Use salt-free seasonings or herbs instead of table salt or sea salt. Check with your health care provider or pharmacist before using salt substitutes.  Do not fry foods. Cook foods using healthy methods such as baking, boiling, grilling, roasting, and broiling instead.  Cook with heart-healthy oils, such as olive, canola,  avocado, soybean, or sunflower oil. Meal planning  Eat a balanced diet that includes: ? 4 or more servings of fruits and 4 or more servings of vegetables each day. Try to fill one-half of your plate with fruits and vegetables. ? 6-8 servings of whole grains each day. ? Less than 6 oz (170 g) of lean meat, poultry, or fish each day. A 3-oz (85-g) serving of meat is about the same size as a deck of  cards. One egg equals 1 oz (28 g). ? 2-3 servings of low-fat dairy each day. One serving is 1 cup (237 mL). ? 1 serving of nuts, seeds, or beans 5 times each week. ? 2-3 servings of heart-healthy fats. Healthy fats called omega-3 fatty acids are found in foods such as walnuts, flaxseeds, fortified milks, and eggs. These fats are also found in cold-water fish, such as sardines, salmon, and mackerel.  Limit how much you eat of: ? Canned or prepackaged foods. ? Food that is high in trans fat, such as some fried foods. ? Food that is high in saturated fat, such as fatty meat. ? Desserts and other sweets, sugary drinks, and other foods with added sugar. ? Full-fat dairy products.  Do not salt foods before eating.  Do not eat more than 4 egg yolks a week.  Try to eat at least 2 vegetarian meals a week.  Eat more home-cooked food and less restaurant, buffet, and fast food.   Lifestyle  When eating at a restaurant, ask that your food be prepared with less salt or no salt, if possible.  If you drink alcohol: ? Limit how much you use to:  0-1 drink a day for women who are not pregnant.  0-2 drinks a day for men. ? Be aware of how much alcohol is in your drink. In the [place] one drink equals one 12 oz bottle of beer (355 mL), one 5 oz glass of wine (148 mL), or one 1 oz glass of hard liquor (44 mL). General information  Avoid eating more than 2,300 mg of salt a day. If you have hypertension, you may need to reduce your sodium intake to 1,500 mg a day.  Work with your health care provider to maintain a healthy body weight or to lose weight. Ask what an ideal weight is for you.  Get at least 30 minutes of exercise that causes your heart to beat faster (aerobic exercise) most days of the week. Activities may include walking, swimming, or biking.  Work with your health care provider or dietitian to adjust your eating plan to your individual calorie needs. What foods should I eat? Fruits All  fresh, dried, or frozen fruit. Canned fruit in natural juice (without added sugar). Vegetables Fresh or frozen vegetables (raw, steamed, roasted, or grilled). Low-sodium or reduced-sodium tomato and vegetable juice. Low-sodium or reduced-sodium tomato sauce and tomato paste. Low-sodium or reduced-sodium canned vegetables. Grains Whole-grain or whole-wheat bread. Whole-grain or whole-wheat pasta. Brown rice. [place] Morrow. Bulgur. Whole-grain and low-sodium cereals. Pita bread. Low-fat, low-sodium crackers. Whole-wheat flour tortillas. Meats and other proteins Skinless chicken or [place]. Ground chicken or [place]. Pork with fat trimmed off. Fish and seafood. Egg whites. Dried beans, peas, or lentils. Unsalted nuts, nut butters, and seeds. Unsalted canned beans. Lean cuts of beef with fat trimmed off. Low-sodium, lean precooked or cured meat, such as sausages or meat loaves. Dairy Low-fat (1%) or fat-free (skim) milk. Reduced-fat, low-fat, or fat-free cheeses. Nonfat, low-sodium ricotta or cottage cheese. Low-fat or  nonfat yogurt. Low-fat, low-sodium cheese. Fats and oils Soft margarine without trans fats. Vegetable oil. Reduced-fat, low-fat, or light mayonnaise and salad dressings (reduced-sodium). Canola, safflower, olive, avocado, soybean, and sunflower oils. Avocado. Seasonings and condiments Herbs. Spices. Seasoning mixes without salt. Other foods Unsalted popcorn and pretzels. Fat-free sweets. The items listed above may not be a complete list of foods and beverages you can eat. Contact a dietitian for more information. What foods should I avoid? Fruits Canned fruit in a light or heavy syrup. Fried fruit. Fruit in cream or butter sauce. Vegetables Creamed or fried vegetables. Vegetables in a cheese sauce. Regular canned vegetables (not low-sodium or reduced-sodium). Regular canned tomato sauce and paste (not low-sodium or reduced-sodium). Regular tomato and vegetable juice (not low-sodium or  reduced-sodium). Angie Fava. Olives. Grains Baked goods made with fat, such as croissants, muffins, or some breads. Dry pasta or rice meal packs. Meats and other proteins Fatty cuts of meat. Ribs. Fried meat. Berniece Salines. Bologna, salami, and other precooked or cured meats, such as sausages or meat loaves. Fat from the back of a pig (fatback). Bratwurst. Salted nuts and seeds. Canned beans with added salt. Canned or smoked fish. Whole eggs or egg yolks. Chicken or [place] with skin. Dairy Whole or 2% milk, cream, and half-and-half. Whole or full-fat cream cheese. Whole-fat or sweetened yogurt. Full-fat cheese. Nondairy creamers. Whipped toppings. Processed cheese and cheese spreads. Fats and oils Butter. Stick margarine. Lard. Shortening. Ghee. Bacon fat. Tropical oils, such as coconut, palm kernel, or palm oil. Seasonings and condiments Onion salt, garlic salt, seasoned salt, table salt, and sea salt. Worcestershire sauce. Tartar sauce. Barbecue sauce. Teriyaki sauce. Soy sauce, including reduced-sodium. Steak sauce. Canned and packaged gravies. Fish sauce. Oyster sauce. Cocktail sauce. Store-bought horseradish. Ketchup. Mustard. Meat flavorings and tenderizers. Bouillon cubes. Hot sauces. Pre-made or packaged marinades. Pre-made or packaged taco seasonings. Relishes. Regular salad dressings. Other foods Salted popcorn and pretzels. The items listed above may not be a complete list of foods and beverages you should avoid. Contact a dietitian for more information. Where to find more information  National Heart, Lung, and Blood Institute: https://wilson-eaton.com/  American Heart Association: www.heart.org  Academy of Nutrition and Dietetics: www.eatright.[place]: www.kidney.org Summary  The DASH eating plan is a healthy eating plan that has been shown to reduce high blood pressure (hypertension). It may also reduce your risk for type 2 diabetes, heart disease, and stroke.  When on  the DASH eating plan, aim to eat more fresh fruits and vegetables, whole grains, lean proteins, low-fat dairy, and heart-healthy fats.  With the DASH eating plan, you should limit salt (sodium) intake to 2,300 mg a day. If you have hypertension, you may need to reduce your sodium intake to 1,500 mg a day.  Work with your health care provider or dietitian to adjust your eating plan to your individual calorie needs. This information is not intended to replace advice given to you by your health care provider. Make sure you discuss any questions you have with your health care provider. Document Revised: 02/16/2019 Document Reviewed: 02/16/2019 Elsevier Patient Education  2021 [employer].

## 2020-07-10 NOTE — Progress Notes (Signed)
Follow-up Outpatient Visit Date: 07/10/2020  Primary Care Provider: Derinda Late, MD [phone] [address] [facility] [place] [address]  Chief Complaint: Follow-up coronary artery disease and persistent atrial fibrillation  HPI:  Joseph Roberson is a 75 y.o. male with history of persistent atrial fibrillation, coronary artery disease with NSTEMI managed medically (08/2019), hypertension, hyperlipidemia, and chronic kidney disease stage III, who presents for follow-up of atrial fibrillation and NSTEMI.  He was last seen in our office in 11/2019 by Laurann Montana, NP, at which time he was feeling well.  At that visit, metoprolol was decreased to 12.5 mg daily due to atrial fibrillation with slow ventricular response.  He was continued on apixaban 5 mg twice daily.  Today, Mr. Howland reports that he continues to feel well, denying chest pain, shortness of breath, palpitations, lightheadedness, and edema.  He has some easy bruising but otherwise does not have any bleeding.  He notes that his PCP recently brought up the issue of continuing aspirin in the setting of long-term anticoagulation with apixaban.  He remains on both at this time.  He has put on 15 pounds over the last several months, which she attributes to being less active during the winter.  He is an avid cyclist and also plays senior softball, which she hopes to get back into.  He does not check his blood pressure regularly at home, though he notes it was normal at his visit last month with Dr. Baldemar Lenis.  --------------------------------------------------------------------------------------------------  Past Medical History:  Diagnosis Date  . Actinic keratosis   . Arthritis   . CKD (chronic kidney disease), stage III ([place])   . Dysrhythmia   . Erectile dysfunction   . Hypercholesteremia   . Hyperlipidemia   . Hypertension   . PAF (paroxysmal atrial fibrillation) ([place])    Past Surgical  History:  Procedure Laterality Date  . CARDIAC CATHETERIZATION    . COLONOSCOPY WITH PROPOFOL N/A 03/07/2015   Procedure: COLONOSCOPY WITH PROPOFOL;  Surgeon: Manya Silvas, MD;  Location: [facility] ENDOSCOPY;  Service: Endoscopy;  Laterality: N/A;  . KNEE ARTHROSCOPY    . LEFT HEART CATH AND CORONARY ANGIOGRAPHY N/A 09/24/2019   Procedure: LEFT HEART CATH AND CORONARY ANGIOGRAPHY;  Surgeon: Wellington Hampshire, MD;  Location: [facility];  Service: Cardiovascular;  Laterality: N/A;  . MENISCUS REPAIR      Current Meds  Medication Sig  . apixaban (ELIQUIS) 5 MG TABS tablet Take 1 tablet (5 mg total) by mouth 2 (two) times daily.  Marland Kitchen aspirin (ASPIRIN EC) 81 MG EC tablet Take 81 mg by mouth daily. Swallow whole.  Marland Kitchen atorvastatin (LIPITOR) 20 MG tablet Take 1 tablet (20 mg total) by mouth daily.  Marland Kitchen losartan (COZAAR) 50 MG tablet Take 1 tablet by mouth daily.  . metoprolol succinate (TOPROL-XL) 25 MG 24 hr tablet Take 0.5 tablets (12.5 mg total) by mouth daily.    Allergies: Cardizem [diltiazem hcl], Crestor [rosuvastatin], Vytorin [ezetimibe-simvastatin], and Zocor [simvastatin]  Social History   Tobacco Use  . Smoking status: Never Smoker  . Smokeless tobacco: Never Used  Vaping Use  . Vaping Use: Never used  Substance Use Topics  . Alcohol use: Yes    Alcohol/week: 1.0 standard drink    Types: 1 Cans of beer per week    Comment: 1 day per week  . Drug use: No    Family History  Problem Relation Age of Onset  . Heart disease Mother   .  Heart disease Father   . Cancer Father     Review of Systems: A 12-system review of systems was performed and was negative except as noted in the HPI.  --------------------------------------------------------------------------------------------------  Physical Exam: BP (!) 166/108 (BP Location: Left Arm, Patient Position: Sitting, Cuff Size: Large)   Pulse (!) 54   Ht 6\' 2"  (1.88 m)   Wt 237 lb (107.5 kg)   SpO2 98%   BMI 30.43  kg/m   Repeat BP: 134/88  General:  NAD. Neck: No JVD or HJR. Lungs: Clear to auscultation bilaterally without wheezes or crackles. Heart: Irregularly irregular without murmurs, rubs, or gallops. Abdomen: Soft, nontender, nondistended. Extremities: Trace pretibial edema.  EKG: Atrial fibrillation with slow ventricular response (ventricular rate 54 bpm).  Otherwise, no significant abnormality.  Lab Results  Component Value Date   WBC 6.0 09/25/2019   HGB 15.7 09/25/2019   HCT 46.5 09/25/2019   MCV 81.4 09/25/2019   PLT 199 09/25/2019    Lab Results  Component Value Date   NA 140 09/25/2019   K 4.3 09/25/2019   CL 107 09/25/2019   CO2 25 09/25/2019   BUN 19 09/25/2019   CREATININE 1.49 (H) 09/25/2019   GLUCOSE 102 (H) 09/25/2019    Lab Results  Component Value Date   CHOL 226 (H) 09/25/2019   HDL 40 (L) 09/25/2019   LDLCALC 161 (H) 09/25/2019   TRIG 126 09/25/2019   CHOLHDL 5.7 09/25/2019    --------------------------------------------------------------------------------------------------  ASSESSMENT AND PLAN: Persistent atrial fibrillation: Mr. Yepiz reports history of atrial fibrillation dating back many years.  I suspect he has remained in atrial fibrillation since his prior visit with [place] though he is largely been asymptomatic.  Ventricular rates remain slightly slow albeit without symptoms.  We we will plan to continue metoprolol succinate 12.5 mg daily and indefinite anticoagulation with apixaban 5 mg twice daily.  Coronary artery disease: No symptoms to suggest worsening coronary insufficiency.  I think it would be reasonable to discontinue aspirin in the setting of long-term anticoagulation with apixaban after the 1 year anniversary of Mr. Verne's NSTEMI in June.  He should continue statin therapy for target LDL less than 70 to prevent progression of coronary atherosclerosis noted at the time of his catheterization last year.  Hyperlipidemia: LDL just  above goal on most recent check with Dr. Baldemar Lenis (35).  We will plan to continue atorvastatin 20 mg daily and work on lifestyle modifications to help bring his LDL below goal of 70.  Hypertension: Initial blood pressure reading today was moderately elevated, improved on recheck.  Recent blood pressure at annual physical with Dr. Abran Richard off was normal.  I will defer medication changes today.  Mr. Dancel should work on lifestyle modifications, including weight loss.  Follow-up: Return to clinic in 6 months.  Nelva Bush, MD 07/10/2020 9:51 AM

## 2020-10-17 ENCOUNTER — Other Ambulatory Visit: Payer: Self-pay | Admitting: Family

## 2020-10-17 DIAGNOSIS — E782 Mixed hyperlipidemia: Secondary | ICD-10-CM

## 2020-10-24 ENCOUNTER — Other Ambulatory Visit: Payer: Self-pay | Admitting: Family

## 2020-10-24 DIAGNOSIS — I4819 Other persistent atrial fibrillation: Secondary | ICD-10-CM

## 2020-10-24 NOTE — Telephone Encounter (Signed)
Prescription refill request for Eliquis received.  Indication: afib  Last office visit: Joseph Roberson, 12/05/2019 Scr: 1.6, 05/29/2020 Age: 75 yo  Weight: 107.5 kg   Pt is on the correct dose of Eliquis per dosing criteria, prescription refill sent for Eliquis '5mg'$  BID.

## 2020-10-27 ENCOUNTER — Ambulatory Visit: Payer: Medicare HMO | Admitting: Podiatry

## 2020-11-05 ENCOUNTER — Encounter: Payer: Self-pay | Admitting: Podiatry

## 2020-11-05 ENCOUNTER — Other Ambulatory Visit: Payer: Self-pay | Admitting: Podiatry

## 2020-11-05 ENCOUNTER — Ambulatory Visit (INDEPENDENT_AMBULATORY_CARE_PROVIDER_SITE_OTHER): Payer: Medicare HMO

## 2020-11-05 ENCOUNTER — Other Ambulatory Visit: Payer: Self-pay

## 2020-11-05 ENCOUNTER — Ambulatory Visit: Payer: Medicare HMO | Admitting: Podiatry

## 2020-11-05 DIAGNOSIS — M2041 Other hammer toe(s) (acquired), right foot: Secondary | ICD-10-CM

## 2020-11-05 DIAGNOSIS — M778 Other enthesopathies, not elsewhere classified: Secondary | ICD-10-CM | POA: Diagnosis not present

## 2020-11-05 DIAGNOSIS — L603 Nail dystrophy: Secondary | ICD-10-CM

## 2020-11-05 DIAGNOSIS — M2011 Hallux valgus (acquired), right foot: Secondary | ICD-10-CM

## 2020-11-05 DIAGNOSIS — D2372 Other benign neoplasm of skin of left lower limb, including hip: Secondary | ICD-10-CM

## 2020-11-05 DIAGNOSIS — D2371 Other benign neoplasm of skin of right lower limb, including hip: Secondary | ICD-10-CM | POA: Diagnosis not present

## 2020-11-05 NOTE — Progress Notes (Signed)
Subjective:  Patient ID: Joseph Roberson, male    DOB: 1945-12-02,  MRN: [id number] HPI Chief Complaint  Patient presents with   Foot Pain    1st and 2nd toes right - corn rubbing each other, tender x months, keeps padded   Nail Problem    Hallux right - curved nail, sometimes sore lateral border   Foot Pain    Plantar forefoot right - "feels like something floating when walking"   New Patient (Initial Visit)    75 y.o. male presents with the above complaint.   ROS: Denies fever chills nausea vomiting muscle aches pains calf pain back pain chest pain shortness of breath.  Past Medical History:  Diagnosis Date   Actinic keratosis    Arthritis    CKD (chronic kidney disease), stage III (H[facility])    Dysrhythmia    Erectile dysfunction    Hypercholesteremia    Hyperlipidemia    Hypertension    PAF (paroxysmal atrial fibrillation) ([place])    Past Surgical History:  Procedure Laterality Date   CARDIAC CATHETERIZATION     COLONOSCOPY WITH PROPOFOL N/A 03/07/2015   Procedure: COLONOSCOPY WITH PROPOFOL;  Surgeon: Manya Silvas, MD;  Location: [facility];  Service: Endoscopy;  Laterality: N/A;   KNEE ARTHROSCOPY     LEFT HEART CATH AND CORONARY ANGIOGRAPHY N/A 09/24/2019   Procedure: LEFT HEART CATH AND CORONARY ANGIOGRAPHY;  Surgeon: Wellington Hampshire, MD;  Location: [facility];  Service: Cardiovascular;  Laterality: N/A;   MENISCUS REPAIR      Current Outpatient Medications:    apixaban (ELIQUIS) 5 MG TABS tablet, TAKE 1 TABLET BY MOUTH TWICE DAILY, Disp: 180 tablet, Rfl: 1   aspirin (ASPIRIN EC) 81 MG EC tablet, Take 81 mg by mouth daily. Swallow whole., Disp: , Rfl:    atorvastatin (LIPITOR) 20 MG tablet, TAKE 1 TABLET BY MOUTH DAILY, Disp: 90 tablet, Rfl: 0   losartan (COZAAR) 50 MG tablet, Take 1 tablet by mouth daily., Disp: , Rfl:    metoprolol succinate (TOPROL-XL) 25 MG 24 hr tablet, Take 0.5 tablets (12.5 mg total) by mouth daily., Disp: , Rfl:   Allergies   Allergen Reactions   Cardizem [Diltiazem Hcl]    Crestor [Rosuvastatin]    Vytorin [Ezetimibe-Simvastatin]    Zocor [Simvastatin]    Review of Systems Objective:  There were no vitals filed for this visit.  General: Well developed, nourished, in no acute distress, alert and oriented x3   Dermatological: Skin is warm, dry and supple bilateral. Nails x 10 are well maintained; remaining integument appears unremarkable at this time. There are no open sores, no preulcerative lesions, no rash or signs of infection present.  Nail dystrophy hallux right and nail dystrophy second digit left  Vascular: Dorsalis Pedis artery and Posterior Tibial artery pedal pulses are 2/4 bilateral with immedate capillary fill time. Pedal hair growth present. No varicosities and no lower extremity edema present bilateral.   Neruologic: Grossly intact via light touch bilateral. Vibratory intact via tuning fork bilateral. Protective threshold with Semmes Wienstein monofilament intact to all pedal sites bilateral. Patellar and Achilles deep tendon reflexes 2+ bilateral. No Babinski or clonus noted bilateral.   Musculoskeletal: No gross boney pedal deformities bilateral. No pain, crepitus, or limitation noted with foot and ankle range of motion bilateral. Muscular strength 5/5 in all groups tested bilateral.  Hallux abductovalgus deformities resulting in juxtaposition reactive hyperkeratotic lesion between the PIPJ of the second toe medially and lateral hallux interphalangeal joint.  No open lesions or wounds are noted.  Hammertoe deformities 2 through 5 bilateral rigid second left.  Gait: Unassisted, Nonantalgic.    Radiographs:  Radiographs taken today demonstrate an osseously mature foot right with a moderate hallux valgus deformity.  Mildly elongated second metatarsal and under lapping third toe.  Otherwise no acute findings are identified.  Assessment & Plan:   Assessment: Hallux abductovalgus deformity with  reactive hyperkeratosis to the opposing joints at the hallux interphalangeal joint and the PIPJ of the first and second toes right foot.  Nail dystrophy hallux right and second digit right.  And early neuritis probably to the forefoot bilaterally.  Plan: Discussed etiology pathology conservative surgical therapies at this point I debrided all reactive hyperkeratotic tissue and benign skin lesions.  Placed padding discussed the forefoot neuritis and the hammertoes as well as surgical correction.  He understands this is amenable to it we will follow-up with me as needed.     Lissete Maestas T. Winn, [place]

## 2020-12-09 DIAGNOSIS — C44319 Basal cell carcinoma of skin of other parts of face: Secondary | ICD-10-CM | POA: Diagnosis not present

## 2020-12-09 DIAGNOSIS — C4441 Basal cell carcinoma of skin of scalp and neck: Secondary | ICD-10-CM | POA: Diagnosis not present

## 2020-12-09 DIAGNOSIS — L570 Actinic keratosis: Secondary | ICD-10-CM | POA: Diagnosis not present

## 2020-12-09 DIAGNOSIS — D485 Neoplasm of uncertain behavior of skin: Secondary | ICD-10-CM | POA: Diagnosis not present

## 2021-01-14 ENCOUNTER — Other Ambulatory Visit: Payer: Self-pay

## 2021-01-14 ENCOUNTER — Ambulatory Visit: Payer: Medicare HMO | Admitting: Internal Medicine

## 2021-01-14 ENCOUNTER — Encounter: Payer: Self-pay | Admitting: Internal Medicine

## 2021-01-14 VITALS — BP 160/92 | HR 55 | Ht 74.0 in | Wt 238.0 lb

## 2021-01-14 DIAGNOSIS — I2510 Atherosclerotic heart disease of native coronary artery without angina pectoris: Secondary | ICD-10-CM | POA: Diagnosis not present

## 2021-01-14 DIAGNOSIS — I10 Essential (primary) hypertension: Secondary | ICD-10-CM | POA: Diagnosis not present

## 2021-01-14 DIAGNOSIS — I480 Paroxysmal atrial fibrillation: Secondary | ICD-10-CM

## 2021-01-14 DIAGNOSIS — E785 Hyperlipidemia, unspecified: Secondary | ICD-10-CM | POA: Diagnosis not present

## 2021-01-14 DIAGNOSIS — I4821 Permanent atrial fibrillation: Secondary | ICD-10-CM

## 2021-01-14 DIAGNOSIS — I214 Non-ST elevation (NSTEMI) myocardial infarction: Secondary | ICD-10-CM | POA: Diagnosis not present

## 2021-01-14 MED ORDER — LOSARTAN POTASSIUM 100 MG PO TABS: 100.0000 mg | ORAL_TABLET | Freq: Every day | ORAL | 2 refills | Status: DC

## 2021-01-14 NOTE — Progress Notes (Signed)
Follow-up Outpatient Visit Date: 01/14/2021  Primary Care Provider: Derinda Late, MD [phone] [address] [facility] [place] [address]  Chief Complaint: Follow-up atrial fibrillation and coronary artery disease  HPI:  Joseph Roberson is a 75 y.o. male with history of persistent atrial fibrillation, coronary artery disease with NSTEMI managed medically (08/2019), hypertension, hyperlipidemia, and chronic kidney disease stage III, who presents for follow-up of coronary artery disease and atrial fibrillation.  I last saw him in April, at which time he was feeling well without chest pain, shortness of breath, palpitations, lightheadedness, or edema.  We agreed to discontinue aspirin after his 1 year anniversary of MI in June, remaining on indefinite anticoagulation with apixaban.  Today, Joseph Roberson reports that he has been feeling well.  He is playing softball and golf without any limitations.  He notes some swelling in his right calf where he was struck by a softball this weekend.  He notes bruising has improved with discontinuation of aspirin.  He has not had any significant bleeding.  He denies chest pain, shortness of breath, palpitations, and lightheadedness.  He remains compliant with his medications, including apixaban.  --------------------------------------------------------------------------------------------------  Past Medical History:  Diagnosis Date   Actinic keratosis    Arthritis    CKD (chronic kidney disease), stage III (HCC)    Dysrhythmia    Erectile dysfunction    Hypercholesteremia    Hyperlipidemia    Hypertension    PAF (paroxysmal atrial fibrillation) ([place])    Past Surgical History:  Procedure Laterality Date   CARDIAC CATHETERIZATION     COLONOSCOPY WITH PROPOFOL N/A 03/07/2015   Procedure: COLONOSCOPY WITH PROPOFOL;  Surgeon: Manya Silvas, MD;  Location: [facility];  Service: Endoscopy;  Laterality: N/A;   KNEE  ARTHROSCOPY     LEFT HEART CATH AND CORONARY ANGIOGRAPHY N/A 09/24/2019   Procedure: LEFT HEART CATH AND CORONARY ANGIOGRAPHY;  Surgeon: Wellington Hampshire, MD;  Location: [facility];  Service: Cardiovascular;  Laterality: N/A;   MENISCUS REPAIR      Current Meds  Medication Sig   apixaban (ELIQUIS) 5 MG TABS tablet TAKE 1 TABLET BY MOUTH TWICE DAILY   atorvastatin (LIPITOR) 20 MG tablet TAKE 1 TABLET BY MOUTH DAILY   [DISCONTINUED] losartan (COZAAR) 50 MG tablet Take 1 tablet by mouth daily.   [DISCONTINUED] metoprolol succinate (TOPROL-XL) 25 MG 24 hr tablet Take 0.5 tablets (12.5 mg total) by mouth daily.    Allergies: Cardizem [diltiazem hcl], Crestor [rosuvastatin], Vytorin [ezetimibe-simvastatin], and Zocor [simvastatin]  Social History   Tobacco Use   Smoking status: Never   Smokeless tobacco: Never  Vaping Use   Vaping Use: Never used  Substance Use Topics   Alcohol use: Yes    Alcohol/week: 1.0 standard drink    Types: 1 Cans of beer per week    Comment: 1 day per week   Drug use: No    Family History  Problem Relation Age of Onset   Heart disease Mother    Heart disease Father    Cancer Father     Review of Systems: A 12-system review of systems was performed and was negative except as noted in the HPI.  --------------------------------------------------------------------------------------------------  Physical Exam: BP (!) 160/92 (BP Location: Left Arm, Patient Position: Sitting, Cuff Size: Large)   Pulse (!) 55   Ht 6\' 2"  (1.88 m)   Wt 238 lb (108 kg)   SpO2 98%   BMI 30.56 kg/m  General:  NAD. Neck: No JVD or HJR. Lungs: Clear to auscultation bilaterally without wheezes or crackles. Heart: Bradycardic but regular without murmurs, rubs, or gallops. Abdomen: Soft, nontender, nondistended. Extremities: Trace right pretibial edema with associated ecchymosis.  EKG: Junctional rhythm with underlying atrial fibrillation.  Junctional rhythm has  replaced atrial fibrillation with slow ventricular response seen on the last tracing from 07/10/2020.  Lab Results  Component Value Date   WBC 6.0 09/25/2019   HGB 15.7 09/25/2019   HCT 46.5 09/25/2019   MCV 81.4 09/25/2019   PLT 199 09/25/2019    Lab Results  Component Value Date   NA 140 09/25/2019   K 4.3 09/25/2019   CL 107 09/25/2019   CO2 25 09/25/2019   BUN 19 09/25/2019   CREATININE 1.49 (H) 09/25/2019   GLUCOSE 102 (H) 09/25/2019    Lab Results  Component Value Date   CHOL 226 (H) 09/25/2019   HDL 40 (L) 09/25/2019   LDLCALC 161 (H) 09/25/2019   TRIG 126 09/25/2019   CHOLHDL 5.7 09/25/2019    --------------------------------------------------------------------------------------------------  ASSESSMENT AND PLAN: Permanent atrial fibrillation: Joseph Roberson continues to have slow ventricular response and today appears to have a junctional rhythm with underlying atrial fibrillation.  We will stop metoprolol at this point and continue apixaban 5 mg twice daily.  Defer cardioversion again as his atrial fibrillation appears to be longstanding without associated symptoms.  Coronary artery disease: No angina reported.  Continue atorvastatin for secondary prevention and apixaban and lieu of aspirin.  We will discontinue metoprolol, as above.  Hypertension: Blood pressure is moderately elevated today.  We have agreed to increase losartan to 100 mg daily.  We will check a BMP today.  Joseph Roberson is scheduled for follow-up including labs through Dr. Baldemar Lenis in 2 to 3 weeks.  BMP should be rechecked at that time.  Hyperlipidemia: LDL borderline at 73 on last check in 05/2020.  Continue atorvastatin 20 mg daily with ongoing monitoring by Dr. Baldemar Lenis.  If LDL remains above 70 on next check, escalation of atorvastatin should be considered.  Follow-up: Return to clinic in 6 months.  Nelva Bush, MD 01/14/2021 2:38 PM

## 2021-01-14 NOTE — Patient Instructions (Signed)
Medication Instructions:   Your physician has recommended you make the following change in your medication:   STOP Metoprolol  INCREASE Losartan 100 mg daily   *If you need a refill on your cardiac medications before your next appointment, please call your pharmacy*   Lab Work:  BMET today  If you have labs (blood work) drawn today and your tests are completely normal, you will receive your results only by: [place] (if you have MyChart) OR A paper copy in the mail If you have any lab test that is abnormal or we need to change your treatment, we will call you to review the results.   Testing/Procedures:  None ordered   Follow-Up: At [facility], you and your health needs are our priority.  As part of our continuing mission to provide you with exceptional heart care, we have created designated Provider Care Teams.  These Care Teams include your primary Cardiologist (physician) and Advanced Practice Providers (APPs -  Physician Assistants and Nurse Practitioners) who all work together to provide you with the care you need, when you need it.  We recommend signing up for the patient portal called "MyChart".  Sign up information is provided on this After Visit Summary.  MyChart is used to connect with patients for Virtual Visits (Telemedicine).  Patients are able to view lab/test results, encounter notes, upcoming appointments, etc.  Non-urgent messages can be sent to your provider as well.   To learn more about what you can do with MyChart, go to [email or web address].    Your next appointment:   6 month(s)  The format for your next appointment:   In Person  Provider:   You may see Nelva Bush, MD or one of the following Advanced Practice Providers on your designated Care Team:   Murray Hodgkins, NP Christell Faith, PA-C Marrianne Mood, PA-C Cadence Kathlen Mody, [place]   Other Instructions  Please see handout given today in office for Eliquis 360 Support: Call  [phone]

## 2021-01-15 ENCOUNTER — Telehealth: Payer: Self-pay | Admitting: Internal Medicine

## 2021-01-15 LAB — BASIC METABOLIC PANEL
BUN/Creatinine Ratio: 14 (ref 10–24)
BUN: 22 mg/dL (ref 8–27)
CO2: 22 mmol/L (ref 20–29)
Calcium: 9.5 mg/dL (ref 8.6–10.2)
Chloride: 105 mmol/L (ref 96–106)
Creatinine, Ser: 1.54 mg/dL — ABNORMAL HIGH (ref 0.76–1.27)
Glucose: 100 mg/dL — ABNORMAL HIGH (ref 70–99)
Potassium: 5.4 mmol/L — ABNORMAL HIGH (ref 3.5–5.2)
Sodium: 143 mmol/L (ref 134–144)
eGFR: 46 mL/min/{1.73_m2} — ABNORMAL LOW (ref >59)

## 2021-01-15 MED ORDER — AMLODIPINE BESYLATE 2.5 MG PO TABS: 2.5000 mg | ORAL_TABLET | Freq: Every day | ORAL | 2 refills | Status: DC

## 2021-01-15 MED ORDER — LOSARTAN POTASSIUM 50 MG PO TABS: 50.0000 mg | ORAL_TABLET | Freq: Every day | ORAL | 2 refills | Status: DC

## 2021-01-15 NOTE — Telephone Encounter (Signed)
Patient made aware of lab results and Dr. Marisue Humble recommendation. Patient is agreeable with the plan and verbalized understanding.

## 2021-01-15 NOTE — Telephone Encounter (Signed)
End, [name], MD  P Cv Div [facility] Triage Please let Joseph Roberson know that his kidney function is stable but his potassium is mildly elevated.  I recommend that we do not increase losartan to 100 mg daily, as discussed yesterday (he should continue with 50 mg daily as he was previously on).  I suggest we try adding amlodipine 2.5 mg daily instead.  He should also limit foods that are particularly high in potassium.  He should have repeat labs with Dr. Baldemar Lenis in 2-3 weeks, as previously scheduled, to reassess his potassium

## 2021-01-29 DIAGNOSIS — R739 Hyperglycemia, unspecified: Secondary | ICD-10-CM | POA: Diagnosis not present

## 2021-01-29 DIAGNOSIS — Z125 Encounter for screening for malignant neoplasm of prostate: Secondary | ICD-10-CM | POA: Diagnosis not present

## 2021-01-29 DIAGNOSIS — Z79899 Other long term (current) drug therapy: Secondary | ICD-10-CM | POA: Diagnosis not present

## 2021-01-29 DIAGNOSIS — I10 Essential (primary) hypertension: Secondary | ICD-10-CM | POA: Diagnosis not present

## 2021-02-05 DIAGNOSIS — Z1331 Encounter for screening for depression: Secondary | ICD-10-CM | POA: Diagnosis not present

## 2021-02-05 DIAGNOSIS — Z23 Encounter for immunization: Secondary | ICD-10-CM | POA: Diagnosis not present

## 2021-02-05 DIAGNOSIS — Z1211 Encounter for screening for malignant neoplasm of colon: Secondary | ICD-10-CM | POA: Diagnosis not present

## 2021-02-05 DIAGNOSIS — Z0000 Encounter for general adult medical examination without abnormal findings: Secondary | ICD-10-CM | POA: Diagnosis not present

## 2021-04-02 ENCOUNTER — Ambulatory Visit: Payer: Medicare HMO | Admitting: Dermatology

## 2021-04-20 ENCOUNTER — Other Ambulatory Visit: Payer: Self-pay | Admitting: Family

## 2021-04-20 DIAGNOSIS — E782 Mixed hyperlipidemia: Secondary | ICD-10-CM

## 2021-06-22 DIAGNOSIS — H5210 Myopia, unspecified eye: Secondary | ICD-10-CM | POA: Diagnosis not present

## 2021-06-22 DIAGNOSIS — I10 Essential (primary) hypertension: Secondary | ICD-10-CM | POA: Diagnosis not present

## 2021-06-22 DIAGNOSIS — E7800 Pure hypercholesterolemia, unspecified: Secondary | ICD-10-CM | POA: Diagnosis not present

## 2021-06-22 DIAGNOSIS — Z0100 Encounter for examination of eyes and vision without abnormal findings: Secondary | ICD-10-CM | POA: Diagnosis not present

## 2021-07-11 ENCOUNTER — Other Ambulatory Visit: Payer: Self-pay | Admitting: Family

## 2021-07-11 ENCOUNTER — Other Ambulatory Visit: Payer: Self-pay | Admitting: Internal Medicine

## 2021-07-11 DIAGNOSIS — I4819 Other persistent atrial fibrillation: Secondary | ICD-10-CM

## 2021-07-13 NOTE — Telephone Encounter (Signed)
Prescription refill request for Eliquis received. ?Indication:Afib ?Last office visit:10/22 ?Scr:1.6 ?Age: 76 ?Weight:108 kg ? ?Prescription refilled ? ?

## 2021-07-16 ENCOUNTER — Ambulatory Visit: Payer: Medicare HMO | Admitting: Internal Medicine

## 2021-07-16 ENCOUNTER — Encounter: Payer: Self-pay | Admitting: Internal Medicine

## 2021-07-16 VITALS — BP 160/84 | HR 59 | Ht 74.0 in | Wt 238.0 lb

## 2021-07-16 DIAGNOSIS — I2510 Atherosclerotic heart disease of native coronary artery without angina pectoris: Secondary | ICD-10-CM | POA: Diagnosis not present

## 2021-07-16 DIAGNOSIS — E785 Hyperlipidemia, unspecified: Secondary | ICD-10-CM | POA: Diagnosis not present

## 2021-07-16 DIAGNOSIS — I10 Essential (primary) hypertension: Secondary | ICD-10-CM | POA: Diagnosis not present

## 2021-07-16 DIAGNOSIS — I4821 Permanent atrial fibrillation: Secondary | ICD-10-CM | POA: Diagnosis not present

## 2021-07-16 NOTE — Progress Notes (Signed)
? ?Follow-up Outpatient Visit ?Date: 07/16/2021 ? ?Primary Care Provider: ?Derinda Late, MD ?[facility] ?[place] [address] ? ?Chief Complaint: Follow-up atrial fibrillation and coronary artery disease ? ?HPI:  Joseph Roberson is a 76 y.o. male with history of permanent atrial fibrillation, coronary artery disease with NSTEMI managed medically (08/2019), hypertension, hyperlipidemia, and chronic kidney disease stage III, who presents for follow-up of CAD and atrial fibrillation.  I last saw him in 12/2020, at which time Joseph Roberson was feeling well.  He was noted to have slow ventricular response with junctional rhythm and underlying atrial fibrillation.  We agreed to discontinue metoprolol.  Due to suboptimal blood pressure control, we initially plan to increase losartan to 100 mg daily.  However, due to hyperkalemia noted at that time, we elected to defer this change and add amlodipine 2.5 mg daily instead. ? ?Today, Joseph Roberson reports that he feels fairly well.  He has been feeling a little more tired recently but attributes this to stress surrounding recent hospitalizations of several family members.  He otherwise has been feeling well, denying chest pain, shortness of breath, palpitations, lightheadedness, and edema.  Home blood pressures are somewhat variable but typically lower than today's reading.  He notes a history of whitecoat hypertension.  He is compliant with his medications and has not felt any difference since adding amlodipine. ? ?-------------------------------------------------------------------------------------------------- ? ?Past Medical History:  ?Diagnosis Date  ? Actinic keratosis   ? Arthritis   ? CKD (chronic kidney disease), stage III ([place])   ? Dysrhythmia   ? Erectile dysfunction   ? Hypercholesteremia   ? Hyperlipidemia   ? Hypertension   ? Permanent atrial fibrillation ([place])   ? ?Past Surgical History:  ?Procedure Laterality  Date  ? CARDIAC CATHETERIZATION    ? COLONOSCOPY WITH PROPOFOL N/A 03/07/2015  ? Procedure: COLONOSCOPY WITH PROPOFOL;  Surgeon: Manya Silvas, MD;  Location: [facility];  Service: Endoscopy;  Laterality: N/A;  ? KNEE ARTHROSCOPY    ? LEFT HEART CATH AND CORONARY ANGIOGRAPHY N/A 09/24/2019  ? Procedure: LEFT HEART CATH AND CORONARY ANGIOGRAPHY;  Surgeon: Wellington Hampshire, MD;  Location: [facility];  Service: Cardiovascular;  Laterality: N/A;  ? MENISCUS REPAIR    ? ? ? ?Recent CV Pertinent Labs: ?Lab Results  ?Component Value Date  ? CHOL 226 (H) 09/25/2019  ? HDL 40 (L) 09/25/2019  ? [place] 161 (H) 09/25/2019  ? TRIG 126 09/25/2019  ? CHOLHDL 5.7 09/25/2019  ? INR 1.0 09/24/2019  ? K 5.4 (H) 01/14/2021  ? MG 1.9 09/24/2019  ? BUN 22 01/14/2021  ? CREATININE 1.54 (H) 01/14/2021  ? ? ?Past medical and surgical history were reviewed and updated in EPIC. ? ?Current Meds  ?Medication Sig  ? amLODipine (NORVASC) 2.5 MG tablet TAKE ONE TABLET (2.5 MG) BY MOUTH EVERY DAY  ? atorvastatin (LIPITOR) 20 MG tablet TAKE 1 TABLET BY MOUTH DAILY  ? ELIQUIS 5 MG TABS tablet TAKE 1 TABLET BY MOUTH TWICE DAILY  ? losartan (COZAAR) 50 MG tablet Take 1 tablet (50 mg total) by mouth daily.  ? ? ?Allergies: Cardizem [diltiazem hcl], Crestor [rosuvastatin], Vytorin [ezetimibe-simvastatin], and Zocor [simvastatin] ? ?Social History  ? ?Tobacco Use  ? Smoking status: Never  ? Smokeless tobacco: Never  ?Vaping Use  ? Vaping Use: Never used  ?Substance Use Topics  ? Alcohol use: Yes  ?  Alcohol/week: 1.0 standard drink  ?  Types: 1  Cans of beer per week  ?  Comment: 1 day per week  ? Drug use: No  ? ? ?Family History  ?Problem Relation Age of Onset  ? Heart disease Mother   ? Heart disease Father   ? Cancer Father   ? ? ?Review of Systems: ?A 12-system review of systems was performed and was negative except as noted in the  HPI. ? ?-------------------------------------------------------------------------------------------------- ? ?Physical Exam: ?BP (!) 160/84 (BP Location: Left Arm, Patient Position: Sitting, Cuff Size: Large)   Pulse (!) 59   Ht '6\' 2"'$  (1.88 m)   Wt 238 lb (108 kg)   SpO2 98%   BMI 30.56 kg/m?  ? ?General:  NAD. ?Neck: No JVD or HJR. ?Lungs: Clear to auscultation bilaterally without wheezes or crackles. ?Heart: Regular rate and rhythm without murmurs, rubs, or gallops. ?Abdomen: Soft, nontender, nondistended. ?Extremities: No lower extremity edema. ? ?EKG: Atrial fibrillation with slow ventricular response and isolated PVC versus aberrancy. ? ?Lab Results  ?Component Value Date  ? WBC 6.0 09/25/2019  ? HGB 15.7 09/25/2019  ? HCT 46.5 09/25/2019  ? MCV 81.4 09/25/2019  ? PLT 199 09/25/2019  ? ? ?Lab Results  ?Component Value Date  ? NA 143 01/14/2021  ? K 5.4 (H) 01/14/2021  ? CL 105 01/14/2021  ? CO2 22 01/14/2021  ? BUN 22 01/14/2021  ? CREATININE 1.54 (H) 01/14/2021  ? GLUCOSE 100 (H) 01/14/2021  ? ? ?Lab Results  ?Component Value Date  ? CHOL 226 (H) 09/25/2019  ? HDL 40 (L) 09/25/2019  ? [place] 161 (H) 09/25/2019  ? TRIG 126 09/25/2019  ? CHOLHDL 5.7 09/25/2019  ? ? ?-------------------------------------------------------------------------------------------------- ? ?ASSESSMENT AND PLAN: ?Permanent atrial fibrillation: ?Ventricular rate still borderline low despite discontinuation of metoprolol.  However, given absence of symptoms, no further intervention is planned at this time.  Continue indefinite anticoagulation with apixaban 5 mg twice daily (based on creatinine trend, dose reduction may need to be considered when Joseph Roberson turns 78). ? ?Coronary artery disease: ?No angina reported.  Continue secondary prevention with atorvastatin and apixaban (in lieu of aspirin given atrial fibrillation). ? ?Hypertension: ?Blood pressure not well controlled again today though Joseph Roberson reports better readings  at home.  We discussed escalation of amlodipine, as hyperkalemia previously limited further dose increase of losartan.  However, Joseph Roberson wishes to keep a closer eye on his blood pressure at home and to work on sodium restriction.  He will alert [place] if his blood pressure is consistently above 140/90. ? ?Hyperlipidemia: ?Most recent lipid panel in 01/2021 with Dr. Baldemar Lenis showed LDL just above goal at 83.  We will continue current dose of atorvastatin and work on lifestyle modifications given history of intolerance to more aggressive statin therapy in the past. ? ?Follow-up: Return to clinic in 6 months. ? ?Nelva Bush, MD ?07/16/2021 ?9:09 AM ? ?

## 2021-07-16 NOTE — Patient Instructions (Signed)
Medication Instructions:  ? ?Your physician recommends that you continue on your current medications as directed. Please refer to the Current Medication list given to you today. ? ?*If you need a refill on your cardiac medications before your next appointment, please call your pharmacy* ? ? ?Lab Work: ? ?None ordered ? ?Testing/Procedures: ? ?None ordered ? ? ?Follow-Up: ?At [facility], you and your health needs are our priority.  As part of our continuing mission to provide you with exceptional heart care, we have created designated Provider Care Teams.  These Care Teams include your primary Cardiologist (physician) and Advanced Practice Providers (APPs -  Physician Assistants and Nurse Practitioners) who all work together to provide you with the care you need, when you need it. ? ?We recommend signing up for the patient portal called "MyChart".  Sign up information is provided on this After Visit Summary.  MyChart is used to connect with patients for Virtual Visits (Telemedicine).  Patients are able to view lab/test results, encounter notes, upcoming appointments, etc.  Non-urgent messages can be sent to your provider as well.   ?To learn more about what you can do with MyChart, go to [email or web address].   ? ?Your next appointment:   ?6 month(s) ? ?The format for your next appointment:   ?In Person ? ?Provider:   ?You may see Nelva Bush, MD or one of the following Advanced Practice Providers on your designated Care Team:   ?Murray Hodgkins, NP ?Christell Faith, PA-C ?Cadence Kathlen Mody, PA-C  ? ? ?Other Instructions ? ?Please monitor your blood pressure at home and send [place] a report of your BP readings through MyChart.  ? ?Important Information About Sugar ? ? ? ? ? ? ?

## 2021-07-17 ENCOUNTER — Encounter: Payer: Self-pay | Admitting: Internal Medicine

## 2021-07-28 DIAGNOSIS — N1832 Chronic kidney disease, stage 3b: Secondary | ICD-10-CM | POA: Diagnosis not present

## 2021-07-28 DIAGNOSIS — R739 Hyperglycemia, unspecified: Secondary | ICD-10-CM | POA: Diagnosis not present

## 2021-07-28 DIAGNOSIS — E7800 Pure hypercholesterolemia, unspecified: Secondary | ICD-10-CM | POA: Diagnosis not present

## 2021-07-28 DIAGNOSIS — Z79899 Other long term (current) drug therapy: Secondary | ICD-10-CM | POA: Diagnosis not present

## 2021-08-06 DIAGNOSIS — Z79899 Other long term (current) drug therapy: Secondary | ICD-10-CM | POA: Diagnosis not present

## 2021-08-06 DIAGNOSIS — R739 Hyperglycemia, unspecified: Secondary | ICD-10-CM | POA: Diagnosis not present

## 2021-08-06 DIAGNOSIS — I480 Paroxysmal atrial fibrillation: Secondary | ICD-10-CM | POA: Diagnosis not present

## 2021-08-06 DIAGNOSIS — I129 Hypertensive chronic kidney disease with stage 1 through stage 4 chronic kidney disease, or unspecified chronic kidney disease: Secondary | ICD-10-CM | POA: Diagnosis not present

## 2021-08-06 DIAGNOSIS — N189 Chronic kidney disease, unspecified: Secondary | ICD-10-CM | POA: Diagnosis not present

## 2021-08-06 DIAGNOSIS — E785 Hyperlipidemia, unspecified: Secondary | ICD-10-CM | POA: Diagnosis not present

## 2021-08-06 DIAGNOSIS — Z125 Encounter for screening for malignant neoplasm of prostate: Secondary | ICD-10-CM | POA: Diagnosis not present

## 2021-09-08 DIAGNOSIS — M1712 Unilateral primary osteoarthritis, left knee: Secondary | ICD-10-CM | POA: Diagnosis not present

## 2021-09-08 DIAGNOSIS — M25562 Pain in left knee: Secondary | ICD-10-CM | POA: Diagnosis not present

## 2021-09-22 DIAGNOSIS — I4891 Unspecified atrial fibrillation: Secondary | ICD-10-CM | POA: Diagnosis not present

## 2021-10-05 DIAGNOSIS — M1712 Unilateral primary osteoarthritis, left knee: Secondary | ICD-10-CM | POA: Diagnosis not present

## 2021-10-16 ENCOUNTER — Other Ambulatory Visit: Payer: Self-pay

## 2021-10-16 DIAGNOSIS — I4819 Other persistent atrial fibrillation: Secondary | ICD-10-CM

## 2021-10-19 MED ORDER — APIXABAN 5 MG PO TABS: 5.0000 mg | ORAL_TABLET | Freq: Two times a day (BID) | ORAL | 3 refills | Status: DC

## 2021-10-19 NOTE — Telephone Encounter (Signed)
Prescription refill request for Eliquis received. Indication:Afib Last office visit:4/23 Scr:1.6 Age: 76 Weight:108 kg  Prescription refilled

## 2021-11-02 ENCOUNTER — Other Ambulatory Visit: Payer: Self-pay | Admitting: Internal Medicine

## 2021-12-14 ENCOUNTER — Ambulatory Visit: Payer: Medicare HMO | Admitting: Podiatry

## 2021-12-16 ENCOUNTER — Ambulatory Visit: Payer: Medicare HMO | Admitting: Podiatry

## 2022-01-14 ENCOUNTER — Other Ambulatory Visit: Payer: Self-pay

## 2022-01-14 DIAGNOSIS — E782 Mixed hyperlipidemia: Secondary | ICD-10-CM

## 2022-01-14 MED ORDER — ATORVASTATIN CALCIUM 20 MG PO TABS: 20.0000 mg | ORAL_TABLET | Freq: Every day | ORAL | 0 refills | Status: DC

## 2022-01-15 ENCOUNTER — Encounter: Payer: Self-pay | Admitting: Internal Medicine

## 2022-01-15 ENCOUNTER — Ambulatory Visit: Payer: Medicare HMO | Attending: Internal Medicine | Admitting: Internal Medicine

## 2022-01-15 VITALS — BP 134/70 | HR 70 | Ht 74.0 in | Wt 242.0 lb

## 2022-01-15 DIAGNOSIS — I4821 Permanent atrial fibrillation: Secondary | ICD-10-CM | POA: Diagnosis not present

## 2022-01-15 DIAGNOSIS — E785 Hyperlipidemia, unspecified: Secondary | ICD-10-CM

## 2022-01-15 DIAGNOSIS — I10 Essential (primary) hypertension: Secondary | ICD-10-CM

## 2022-01-15 DIAGNOSIS — I2510 Atherosclerotic heart disease of native coronary artery without angina pectoris: Secondary | ICD-10-CM | POA: Diagnosis not present

## 2022-01-15 NOTE — Patient Instructions (Signed)
Medication Instructions:  Your physician recommends that you continue on your current medications as directed. Please refer to the Current Medication list given to you today.  *If you need a refill on your cardiac medications before your next appointment, please call your pharmacy*  Lab Work: None ordered  If you have labs (blood work) drawn today and your tests are completely normal, you will receive your results only by: [place] (if you have MyChart) OR A paper copy in the mail If you have any lab test that is abnormal or we need to change your treatment, we will call you to review the results.   Testing/Procedures: None ordered  Follow-Up: At [facility], you and your health needs are our priority.  As part of our continuing mission to provide you with exceptional heart care, we have created designated Provider Care Teams.  These Care Teams include your primary Cardiologist (physician) and Advanced Practice Providers (APPs -  Physician Assistants and Nurse Practitioners) who all work together to provide you with the care you need, when you need it.  We recommend signing up for the patient portal called "MyChart".  Sign up information is provided on this After Visit Summary.  MyChart is used to connect with patients for Virtual Visits (Telemedicine).  Patients are able to view lab/test results, encounter notes, upcoming appointments, etc.  Non-urgent messages can be sent to your provider as well.   To learn more about what you can do with MyChart, go to [email or web address].    Your next appointment:   6 month(s)  The format for your next appointment:   In Person  Provider:   You may see Nelva Bush, MD or one of the following Advanced Practice Providers on your designated Care Team:   Murray Hodgkins, NP Christell Faith, PA-C Cadence Kathlen Mody, PA-C Gerrie Nordmann, NP   Important Information About Sugar

## 2022-01-15 NOTE — Progress Notes (Signed)
Follow-up Outpatient Visit Date: 01/15/2022  Primary Care Provider: Derinda Late, MD [address] [facility] [facility] [place] [address]  Chief Complaint: Follow-up atrial fibrillation and coronary artery disease  HPI:  Joseph Roberson is a 76 y.o. male with history of permanent atrial fibrillation, coronary artery disease with NSTEMI managed medically (08/2019), hypertension, hyperlipidemia, and chronic kidney disease stage III, who presents for follow-up of coronary artery disease and atrial fibrillation.  I last saw him in April, at which time he reported slightly more fatigue that he attributed to stress surrounding hospitalizations with several family members.  Today, Joseph Roberson reports that he is feeling well.  He has cut down on his caffeine intake and has noticed a significant reduction in his palpitations.  He denies chest pain, shortness of breath, lightheadedness, edema, and bleeding.  Home blood pressures have been somewhat variable with systolic readings of 846-962 mmHg, though it is usually around 120.  He notes a history of whitecoat hypertension.  --------------------------------------------------------------------------------------------------  Past Medical History:  Diagnosis Date   Actinic keratosis    Arthritis    CKD (chronic kidney disease), stage III ([place])    Dysrhythmia    Erectile dysfunction    Hypercholesteremia    Hyperlipidemia    Hypertension    Permanent atrial fibrillation ([place])    Past Surgical History:  Procedure Laterality Date   CARDIAC CATHETERIZATION     COLONOSCOPY WITH PROPOFOL N/A 03/07/2015   Procedure: COLONOSCOPY WITH PROPOFOL;  Surgeon: Manya Silvas, MD;  Location: [facility];  Service: Endoscopy;  Laterality: N/A;   KNEE ARTHROSCOPY     LEFT HEART CATH AND CORONARY ANGIOGRAPHY N/A 09/24/2019   Procedure: LEFT HEART CATH AND CORONARY ANGIOGRAPHY;  Surgeon: Wellington Hampshire, MD;  Location:  [facility];  Service: Cardiovascular;  Laterality: N/A;   MENISCUS REPAIR      Current Meds  Medication Sig   amLODipine (NORVASC) 2.5 MG tablet TAKE 1 TABLET BY MOUTH DAILY   apixaban (ELIQUIS) 5 MG TABS tablet Take 1 tablet (5 mg total) by mouth 2 (two) times daily.   atorvastatin (LIPITOR) 20 MG tablet Take 1 tablet (20 mg total) by mouth daily.   losartan (COZAAR) 50 MG tablet Take 1 tablet (50 mg total) by mouth daily.    Allergies: Cardizem [diltiazem hcl], Crestor [rosuvastatin], Vytorin [ezetimibe-simvastatin], and Zocor [simvastatin]  Social History   Tobacco Use   Smoking status: Never   Smokeless tobacco: Never  Vaping Use   Vaping Use: Never used  Substance Use Topics   Alcohol use: Yes    Alcohol/week: 1.0 standard drink of alcohol    Types: 1 Cans of beer per week    Comment: 1 day per week   Drug use: No    Family History  Problem Relation Age of Onset   Heart disease Mother    Heart disease Father    Cancer Father     Review of Systems: A 12-system review of systems was performed and was negative except as noted in the HPI.  --------------------------------------------------------------------------------------------------  Physical Exam: BP (!) 140/82 (BP Location: Left Arm, Patient Position: Sitting, Cuff Size: Large)   Pulse 70   Ht '6\' 2"'$  (1.88 m)   Wt 242 lb (109.8 kg)   SpO2 97%   BMI 31.07 kg/m  Repeat BP: 134/70  General:  NAD. Neck: No JVD or HJR. Lungs: Clear to auscultation bilaterally without wheezes or crackles. Heart: Irregularly irregular rhythm  without murmurs, rubs, or gallops. Abdomen: Soft, nontender, nondistended. Extremities: Trace pretibial edema.  EKG: Atrial fibrillation (ventricular rate 70 bpm).  Heart rate has increased since 07/16/2021.  PVC versus a aberrancy is no longer present.  Lab Results  Component Value Date   WBC 6.0 09/25/2019   HGB 15.7 09/25/2019   HCT 46.5 09/25/2019   MCV 81.4 09/25/2019    PLT 199 09/25/2019    Lab Results  Component Value Date   NA 143 01/14/2021   K 5.4 (H) 01/14/2021   CL 105 01/14/2021   CO2 22 01/14/2021   BUN 22 01/14/2021   CREATININE 1.54 (H) 01/14/2021   GLUCOSE 100 (H) 01/14/2021    Lab Results  Component Value Date   CHOL 226 (H) 09/25/2019   HDL 40 (L) 09/25/2019   LDLCALC 161 (H) 09/25/2019   TRIG 126 09/25/2019   CHOLHDL 5.7 09/25/2019    --------------------------------------------------------------------------------------------------  ASSESSMENT AND PLAN: Coronary artery disease and hyperlipidemia: No angina reported.  Continue secondary prevention with atorvastatin and apixaban and lieu of aspirin.  LDL reasonable at 70 on last check with Dr. Baldemar Lenis in May.  Repeat labs coming up in the next few weeks.  Permanent atrial fibrillation: Ventricular rate control has remained stable.  Continue apixaban 5 mg twice daily.  With chronic kidney disease and creatinine around 1.6, dose reduction will need to be performed when Joseph Roberson turns 53.  Chronic kidney disease stage IIIa: This has been stable for the last few years.  Continue monitoring with Dr. Baldemar Lenis and avoidance of nephrotoxic drugs.  Hypertension: Blood pressure mildly elevated on initial check, somewhat improved on recheck.  Joseph Roberson notes that home blood pressures are typically a bit lower.  We will continue current doses of amlodipine and losartan.  Follow-up: Return to clinic in 6 months.  Nelva Bush, MD 01/15/2022 8:27 AM

## 2022-02-01 ENCOUNTER — Other Ambulatory Visit: Payer: Self-pay | Admitting: Internal Medicine

## 2022-02-04 DIAGNOSIS — R739 Hyperglycemia, unspecified: Secondary | ICD-10-CM | POA: Diagnosis not present

## 2022-02-04 DIAGNOSIS — I10 Essential (primary) hypertension: Secondary | ICD-10-CM | POA: Diagnosis not present

## 2022-02-04 DIAGNOSIS — N1832 Chronic kidney disease, stage 3b: Secondary | ICD-10-CM | POA: Diagnosis not present

## 2022-02-04 DIAGNOSIS — E7800 Pure hypercholesterolemia, unspecified: Secondary | ICD-10-CM | POA: Diagnosis not present

## 2022-02-04 DIAGNOSIS — Z125 Encounter for screening for malignant neoplasm of prostate: Secondary | ICD-10-CM | POA: Diagnosis not present

## 2022-02-11 DIAGNOSIS — Z23 Encounter for immunization: Secondary | ICD-10-CM | POA: Diagnosis not present

## 2022-02-11 DIAGNOSIS — Z1331 Encounter for screening for depression: Secondary | ICD-10-CM | POA: Diagnosis not present

## 2022-02-11 DIAGNOSIS — Z0000 Encounter for general adult medical examination without abnormal findings: Secondary | ICD-10-CM | POA: Diagnosis not present

## 2022-03-17 IMAGING — CR DG CHEST 2V
2 series · 2 of 2 positions shown · non-contrast
Comparison: Chest CT and radiographs 04/02/2005.

CLINICAL DATA: 73-year-old male with persistent left side
nonradiating chest pain.

EXAM:
CHEST - 2 VIEW

[chest pa]
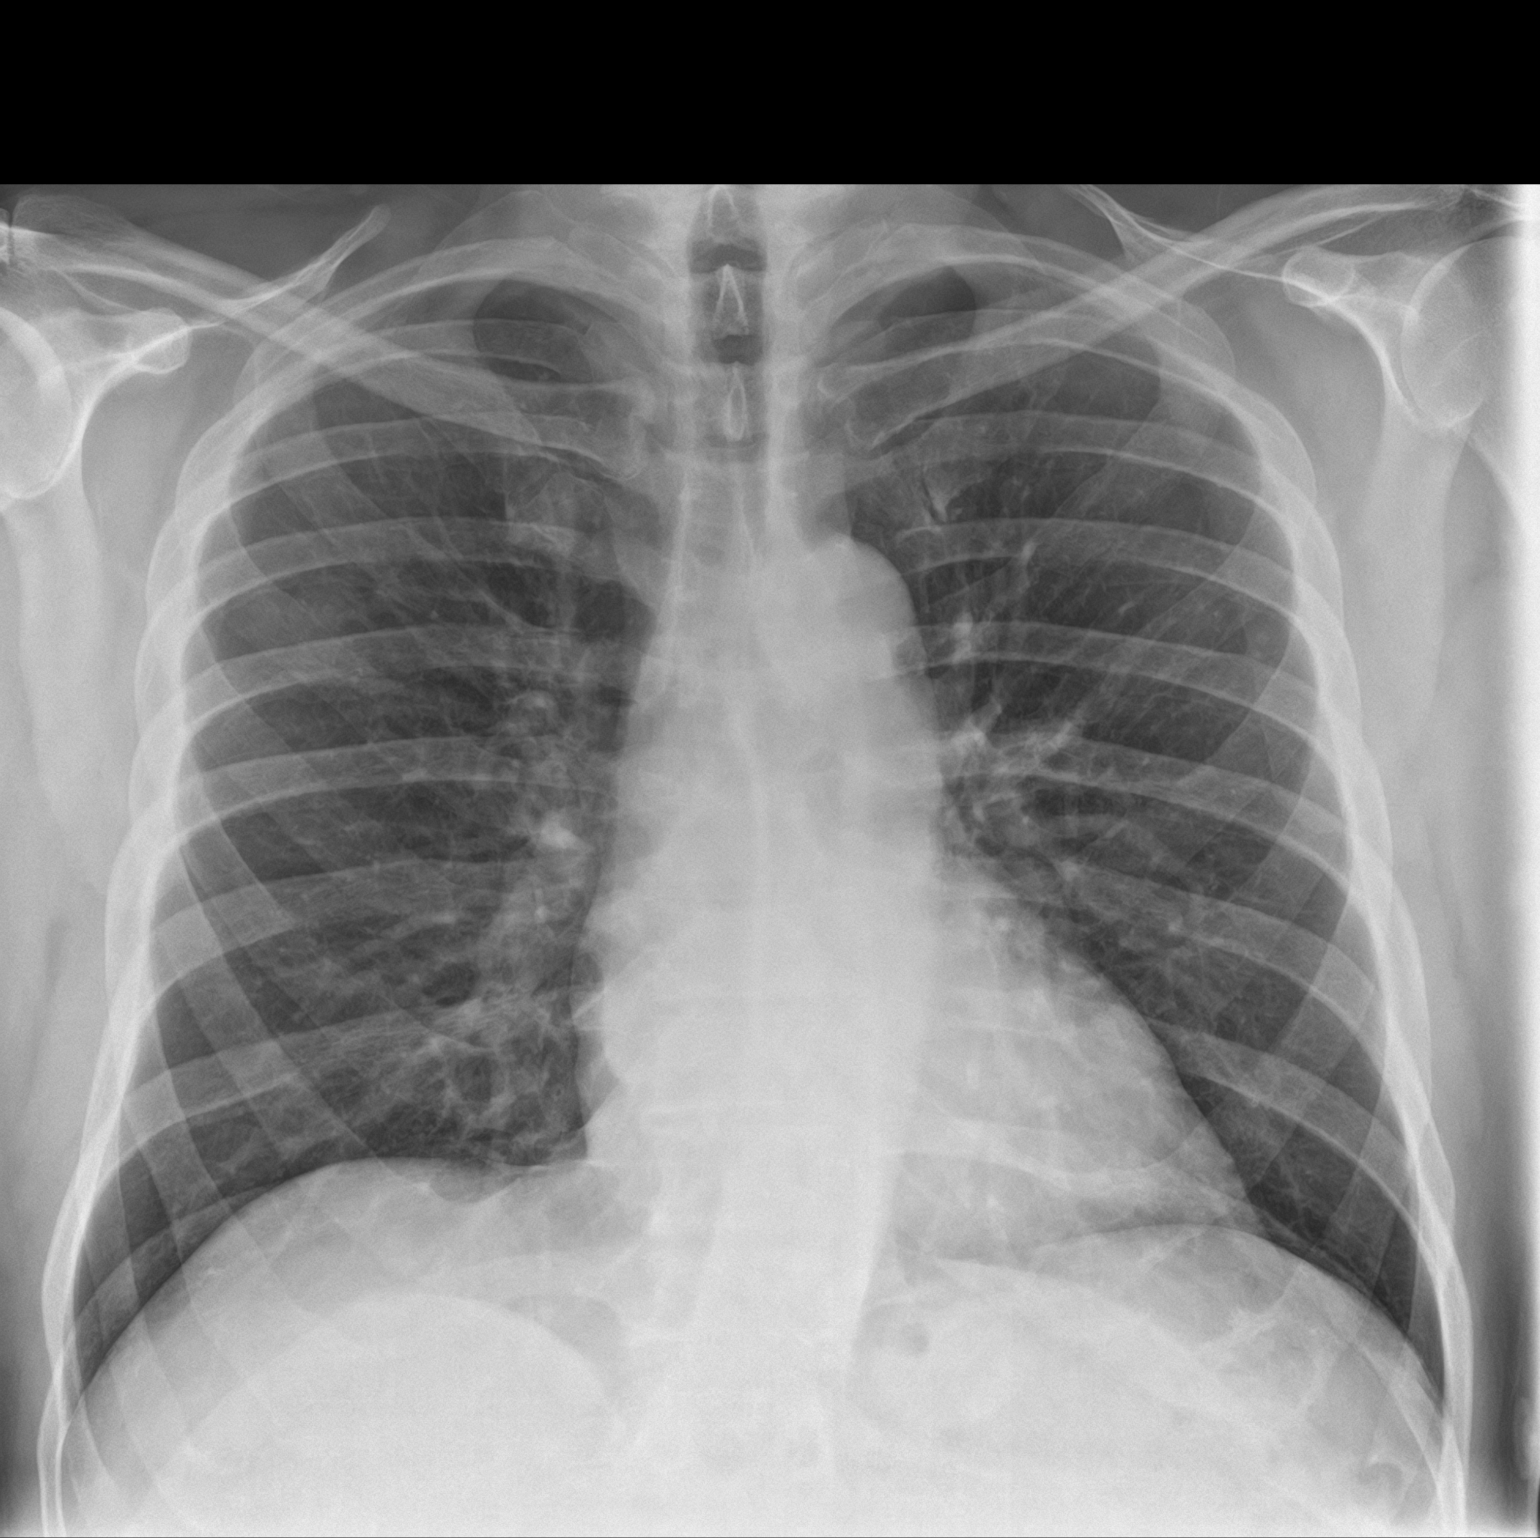

[chest lat]
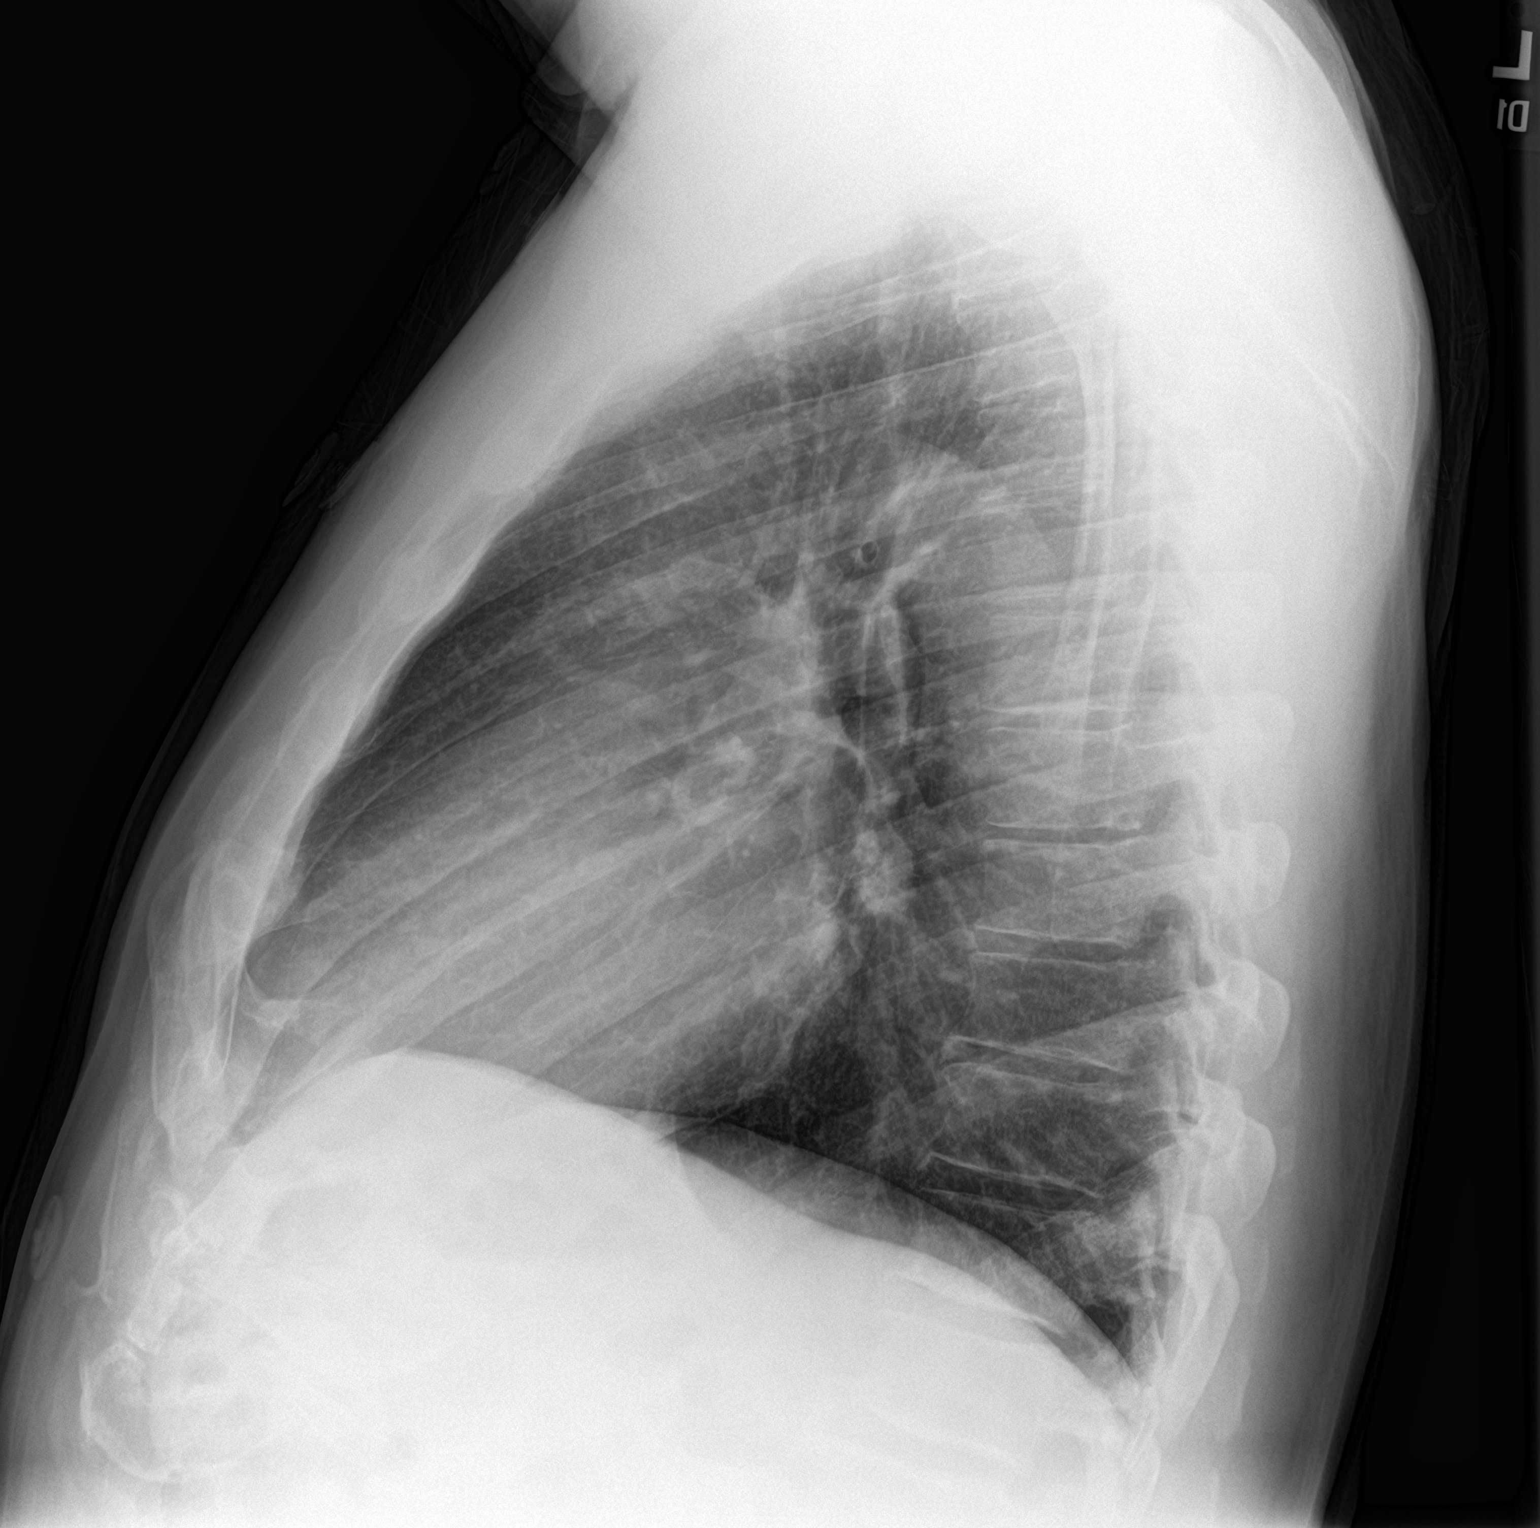

[2 of 2 positions shown; findings below may reference images not displayed]

FINDINGS: Lung volumes and mediastinal contours remain normal. Visualized
tracheal air column is within normal limits. Both lungs appear
clear. No pneumothorax or pleural effusion.

Negative visible bowel gas pattern and osseous structures.
IMPRESSION: Negative.  No acute cardiopulmonary abnormality.

## 2022-04-14 ENCOUNTER — Other Ambulatory Visit: Payer: Self-pay | Admitting: Internal Medicine

## 2022-04-14 DIAGNOSIS — E782 Mixed hyperlipidemia: Secondary | ICD-10-CM

## 2022-07-12 ENCOUNTER — Other Ambulatory Visit: Payer: Self-pay | Admitting: Internal Medicine

## 2022-07-12 DIAGNOSIS — E782 Mixed hyperlipidemia: Secondary | ICD-10-CM

## 2022-07-12 NOTE — Telephone Encounter (Signed)
Please contact pt for future appointment. Pt due for 6 month f/u. 

## 2022-07-14 NOTE — Telephone Encounter (Signed)
Left voicemail to schedule follow up appointment

## 2022-07-21 ENCOUNTER — Encounter: Payer: Self-pay | Admitting: Internal Medicine

## 2022-07-21 ENCOUNTER — Ambulatory Visit: Payer: Medicare HMO | Attending: Internal Medicine | Admitting: Internal Medicine

## 2022-07-21 VITALS — BP 158/84 | HR 57 | Ht 74.0 in | Wt 244.0 lb

## 2022-07-21 DIAGNOSIS — E785 Hyperlipidemia, unspecified: Secondary | ICD-10-CM

## 2022-07-21 DIAGNOSIS — I2510 Atherosclerotic heart disease of native coronary artery without angina pectoris: Secondary | ICD-10-CM | POA: Diagnosis not present

## 2022-07-21 DIAGNOSIS — I4821 Permanent atrial fibrillation: Secondary | ICD-10-CM | POA: Diagnosis not present

## 2022-07-21 DIAGNOSIS — I10 Essential (primary) hypertension: Secondary | ICD-10-CM | POA: Diagnosis not present

## 2022-07-21 NOTE — Patient Instructions (Signed)
Medication Instructions:  Your Physician recommend you continue on your current medication as directed.    *If you need a refill on your cardiac medications before your next appointment, please call your pharmacy*   Lab Work: None ordered    Testing/Procedures: None ordered   Follow-Up: At [facility], you and your health needs are our priority.  As part of our continuing mission to provide you with exceptional heart care, we have created designated Provider Care Teams.  These Care Teams include your primary Cardiologist (physician) and Advanced Practice Providers (APPs -  Physician Assistants and Nurse Practitioners) who all work together to provide you with the care you need, when you need it.  We recommend signing up for the patient portal called "MyChart".  Sign up information is provided on this After Visit Summary.  MyChart is used to connect with patients for Virtual Visits (Telemedicine).  Patients are able to view lab/test results, encounter notes, upcoming appointments, etc.  Non-urgent messages can be sent to your provider as well.   To learn more about what you can do with MyChart, go to [email or web address].    Your next appointment:   6 month(s)  Provider:   You may see Yvonne Kendall, MD or one of the following Advanced Practice Providers on your designated Care Team:   Nicolasa Ducking, NP Eula Listen, PA-C Cadence Fransico Michael, PA-C Charlsie Quest, NP    Dr. Okey Dupre recommends checking and keeping a log of your blood pressure daily. Please call your primary care if you notice your blood pressure is consistently high  It is best to check your blood pressure 1-2 hours after taking your medications.  Below are some tips that our clinical pharmacists share for home BP monitoring:          Rest 10 minutes before taking your blood pressure.          Don't smoke or drink caffeinated beverages for at least 30 minutes before.          Take your blood pressure before  (not after) you eat.          Sit comfortably with your back supported and both feet on the floor (don't cross your legs).          Elevate your arm to heart level on a table or a desk.          Use the proper sized cuff. It should fit smoothly and snugly around your bare upper arm. There should be enough room to slip a fingertip under the cuff. The bottom edge of the cuff should be 1 inch above the crease of the elbow.

## 2022-07-21 NOTE — Progress Notes (Signed)
Follow-up Outpatient Visit Date: 07/21/2022  Primary Care Provider: Kandyce Rud, MD [phone] [address] [facility] [place] [address]  Chief Complaint: Follow-up atrial fibrillation and coronary artery disease  HPI:  Mr. Joseph Roberson is a 77 y.o. male with history of permanent atrial fibrillation, coronary artery disease with NSTEMI managed medically (08/2019), hypertension, hyperlipidemia, and chronic kidney disease stage III, who presents for follow-up of coronary artery disease and atrial fibrillation.  I last saw him in October, at which time he was feeling well.  We did not make any medication changes or pursue additional testing.  Today, Mr. Joseph Roberson reports that he has been feeling well.  He denies chest pain, shortness of breath, palpitations, and lightheadedness.  He occasionally notes mild ankle swelling but has not had any persistent edema.  He intermittently checks his blood pressure at home and notes that it is typically upper normal to mildly elevated, sometimes in the 140s over 80s.  He is scheduled for follow-up with Dr. Larwance Sachs next month.  --------------------------------------------------------------------------------------------------  Past Medical History:  Diagnosis Date   Actinic keratosis    Arthritis    CKD (chronic kidney disease), stage III (HCC)    Dysrhythmia    Erectile dysfunction    Hypercholesteremia    Hyperlipidemia    Hypertension    Permanent atrial fibrillation [facility])    Past Surgical History:  Procedure Laterality Date   CARDIAC CATHETERIZATION     COLONOSCOPY WITH PROPOFOL N/A 03/07/2015   Procedure: COLONOSCOPY WITH PROPOFOL;  Surgeon: Scot Jun, MD;  Location: [facility] ENDOSCOPY;  Service: Endoscopy;  Laterality: N/A;   KNEE ARTHROSCOPY     LEFT HEART CATH AND CORONARY ANGIOGRAPHY N/A 09/24/2019   Procedure: LEFT HEART CATH AND CORONARY ANGIOGRAPHY;  Surgeon: Iran Ouch, MD;  Location: [facility]  INVASIVE CV LAB;  Service: Cardiovascular;  Laterality: N/A;   MENISCUS REPAIR      Current Meds  Medication Sig   amLODipine (NORVASC) 2.5 MG tablet TAKE 1 TABLET BY MOUTH DAILY   apixaban (ELIQUIS) 5 MG TABS tablet Take 1 tablet (5 mg total) by mouth 2 (two) times daily.   atorvastatin (LIPITOR) 20 MG tablet TAKE ONE TABLET BY MOUTH DAILY   losartan (COZAAR) 50 MG tablet Take 1 tablet (50 mg total) by mouth daily.    Allergies: Cardizem [diltiazem hcl], Crestor [rosuvastatin], Vytorin [ezetimibe-simvastatin], and Zocor [simvastatin]  Social History   Tobacco Use   Smoking status: Never   Smokeless tobacco: Never  Vaping Use   Vaping Use: Never used  Substance Use Topics   Alcohol use: Yes    Alcohol/week: 1.0 standard drink of alcohol    Types: 1 Cans of beer per week    Comment: 1 day per week   Drug use: No    Family History  Problem Relation Age of Onset   Heart disease Mother    Heart disease Father    Cancer Father     Review of Systems: A 12-system review of systems was performed and was negative except as noted in the HPI.  --------------------------------------------------------------------------------------------------  Physical Exam: BP (!) 158/84 (BP Location: Left Arm, Patient Position: Sitting)   Pulse (!) 57   Ht  (1.88 m)   Wt 244 lb (110.7 kg)   SpO2 98%   BMI 31.33 kg/m  Repeat BP: 148/82  General:  NAD. Neck: No JVD or HJR. Lungs: Clear to auscultation bilaterally without wheezes or crackles. Heart: Irregularly irregular rhythm  without murmurs. Abdomen: Soft, nontender, nondistended. Extremities: Trace ankle edema bilaterally.  EKG: Atrial fibrillation with slow ventricular response (ventricular rate 57 bpm).  Otherwise, no significant abnormalities.  Ventricular rate has decreased since 01/15/2022.  Otherwise, no significant interval change.  Lab Results  Component Value Date   WBC 6.0 09/25/2019   HGB 15.7 09/25/2019   HCT  46.5 09/25/2019   MCV 81.4 09/25/2019   PLT 199 09/25/2019    Lab Results  Component Value Date   NA 143 01/14/2021   K 5.4 (H) 01/14/2021   CL 105 01/14/2021   CO2 22 01/14/2021   BUN 22 01/14/2021   CREATININE 1.54 (H) 01/14/2021   GLUCOSE 100 (H) 01/14/2021    Lab Results  Component Value Date   CHOL 226 (H) 09/25/2019   HDL 40 (L) 09/25/2019   LDLCALC 161 (H) 09/25/2019   TRIG 126 09/25/2019   CHOLHDL 5.7 09/25/2019    --------------------------------------------------------------------------------------------------  ASSESSMENT AND PLAN: Permanent atrial fibrillation: Borderline slow ventricular response noted today without symptoms and off AV-nodal blocking agents.  Continue apixaban 5 mg BID.  Coronary artery disease and hyperlipidemia: No angina reported.  LDL and triglycerides reasonable on last check in 01/2022 with Dr. Larwance Sachs (LDL 63, TG 99).  Continue atorvastatin 20 mg daily.  Hypertension: BP mildly elevated today.  We discussed escalation of amlodipine to 5 mg daily.  However, Mr. Joseph Roberson wishes to monitor his BP and work on sodium restriction.  If his BP remains > 130/80 when he sees Dr. Larwance Sachs next month, titration of amlodipine should be reconsidered.  Follow-up: Return to clinic in 6 months.  Yvonne Kendall, MD 07/21/2022 8:08 AM

## 2022-07-23 NOTE — Addendum Note (Signed)
Addended by: Ihor Dow on: 07/23/2022 09:30 AM   Modules accepted: Orders

## 2022-07-30 ENCOUNTER — Other Ambulatory Visit: Payer: Self-pay | Admitting: Internal Medicine

## 2022-08-12 DIAGNOSIS — N1832 Chronic kidney disease, stage 3b: Secondary | ICD-10-CM | POA: Diagnosis not present

## 2022-08-12 DIAGNOSIS — R7303 Prediabetes: Secondary | ICD-10-CM | POA: Diagnosis not present

## 2022-08-12 DIAGNOSIS — E7800 Pure hypercholesterolemia, unspecified: Secondary | ICD-10-CM | POA: Diagnosis not present

## 2022-08-12 DIAGNOSIS — Z79899 Other long term (current) drug therapy: Secondary | ICD-10-CM | POA: Diagnosis not present

## 2022-08-19 DIAGNOSIS — E785 Hyperlipidemia, unspecified: Secondary | ICD-10-CM | POA: Diagnosis not present

## 2022-08-19 DIAGNOSIS — I129 Hypertensive chronic kidney disease with stage 1 through stage 4 chronic kidney disease, or unspecified chronic kidney disease: Secondary | ICD-10-CM | POA: Diagnosis not present

## 2022-08-19 DIAGNOSIS — N189 Chronic kidney disease, unspecified: Secondary | ICD-10-CM | POA: Diagnosis not present

## 2022-08-19 DIAGNOSIS — Z79899 Other long term (current) drug therapy: Secondary | ICD-10-CM | POA: Diagnosis not present

## 2022-08-19 DIAGNOSIS — I4891 Unspecified atrial fibrillation: Secondary | ICD-10-CM | POA: Diagnosis not present

## 2022-08-19 DIAGNOSIS — R7303 Prediabetes: Secondary | ICD-10-CM | POA: Diagnosis not present

## 2022-12-16 DIAGNOSIS — H524 Presbyopia: Secondary | ICD-10-CM | POA: Diagnosis not present

## 2022-12-16 DIAGNOSIS — H52223 Regular astigmatism, bilateral: Secondary | ICD-10-CM | POA: Diagnosis not present

## 2023-01-01 ENCOUNTER — Other Ambulatory Visit: Payer: Self-pay | Admitting: Internal Medicine

## 2023-01-01 DIAGNOSIS — I4819 Other persistent atrial fibrillation: Secondary | ICD-10-CM

## 2023-01-03 NOTE — Telephone Encounter (Signed)
Prescription refill request for Eliquis received. Indication:afib Last office visit:4/24 Scr:1.5  5/24 Age: 77 Weight:110.7  kg  Prescription refilled

## 2023-02-01 DIAGNOSIS — Z79899 Other long term (current) drug therapy: Secondary | ICD-10-CM | POA: Diagnosis not present

## 2023-02-01 DIAGNOSIS — R7303 Prediabetes: Secondary | ICD-10-CM | POA: Diagnosis not present

## 2023-02-01 DIAGNOSIS — E7800 Pure hypercholesterolemia, unspecified: Secondary | ICD-10-CM | POA: Diagnosis not present

## 2023-02-01 DIAGNOSIS — N1832 Chronic kidney disease, stage 3b: Secondary | ICD-10-CM | POA: Diagnosis not present

## 2023-03-02 DIAGNOSIS — L989 Disorder of the skin and subcutaneous tissue, unspecified: Secondary | ICD-10-CM | POA: Diagnosis not present

## 2023-03-10 DIAGNOSIS — D0439 Carcinoma in situ of skin of other parts of face: Secondary | ICD-10-CM | POA: Diagnosis not present

## 2023-03-10 DIAGNOSIS — L858 Other specified epidermal thickening: Secondary | ICD-10-CM | POA: Diagnosis not present

## 2023-03-10 DIAGNOSIS — C44329 Squamous cell carcinoma of skin of other parts of face: Secondary | ICD-10-CM | POA: Diagnosis not present

## 2023-03-10 DIAGNOSIS — R208 Other disturbances of skin sensation: Secondary | ICD-10-CM | POA: Diagnosis not present

## 2023-03-10 DIAGNOSIS — L570 Actinic keratosis: Secondary | ICD-10-CM | POA: Diagnosis not present

## 2023-03-10 DIAGNOSIS — D485 Neoplasm of uncertain behavior of skin: Secondary | ICD-10-CM | POA: Diagnosis not present

## 2023-03-10 DIAGNOSIS — C44319 Basal cell carcinoma of skin of other parts of face: Secondary | ICD-10-CM | POA: Diagnosis not present

## 2023-04-15 ENCOUNTER — Other Ambulatory Visit: Payer: Self-pay | Admitting: Internal Medicine

## 2023-04-15 DIAGNOSIS — E782 Mixed hyperlipidemia: Secondary | ICD-10-CM

## 2023-04-20 NOTE — Progress Notes (Unsigned)
Cardiology Office Note:  .   Date:  04/21/2023  ID:  Modesta Messing, DOB [birth year], MRN [id number] PCP: Kandyce Rud, MD  [facility] Providers Cardiologist:  Yvonne Kendall, MD     History of Present Illness: .   TERRAN LAWRIE is a 78 y.o. male with history of permanent atrial fibrillation, coronary artery disease with NSTEMI managed medically (08/2019), hypertension, hyperlipidemia, and chronic kidney disease stage III, who returns for follow-up of coronary artery disease and atrial fibrillation.  I last saw him in 06/2022, at which time he was feeling well other than intermittent mild ankle edema.  Blood pressure at that visit as well as on home checks was mildly to moderately elevated.  We discussed escalation of amlodipine, though Mr. Brownridge wished to defer this and follow-up with his PCP the following month for further management.  Today, Mr. Lordan reports he has been feeling fairly well.  He denies chest pain, shortness of breath, palpitations, and lightheadedness.  He notes some occasional edema that is dependent and resolves with leg elevation.  He has been walking some at home.  He notes that his blood pressures at home typically fluctuate between 125 and 145/75-85.  He notes that amlodipine was discontinued in the spring by Dr. Larwance Sachs due to fatigue and low normal blood pressure.  Fatigue has since resolved.  ROS: See HPI  Studies Reviewed: Marland Kitchen   EKG Interpretation Date/Time:  Thursday April 21 2023 08:02:10 EST Ventricular Rate:  65 PR Interval:    QRS Duration:  112 QT Interval:  420 QTC Calculation: 436 R Axis:   11  Text Interpretation: Atrial fibrillation with premature ventricular or aberrantly conducted complexes When compared with ECG of 21-Jul-2022 Vent. rate has increased Confirmed by Lindsea Olivar [phone]) on 04/21/2023 8:09:46 AM     Risk Assessment/Calculations:    CHA2DS2-VASc Score = 4   This indicates a 4.8% annual risk of  stroke. The patient's score is based upon: CHF History: 0 HTN History: 1 Diabetes History: 0 Stroke History: 0 Vascular Disease History: 1 Age Score: 2 Gender Score: 0    HYPERTENSION CONTROL Vitals:   04/21/23 0756 04/21/23 0927  BP: (!) 140/83 (!) 144/88    The patient's blood pressure is elevated above target today.  In order to address the patient's elevated BP: A current anti-hypertensive medication was adjusted today.          Physical Exam:   VS:  BP (!) 144/88   Pulse 65   Ht 6' 1.5" (1.867 m)   Wt 240 lb 9.6 oz (109.1 kg)   SpO2 99%   BMI 31.31 kg/m    Wt Readings from Last 3 Encounters:  04/21/23 240 lb 9.6 oz (109.1 kg)  07/21/22 244 lb (110.7 kg)  01/15/22 242 lb (109.8 kg)    General:  NAD. Neck: No JVD or HJR. Lungs: Clear to auscultation bilaterally without wheezes or crackles. Heart: Irregularly irregular rhythm without murmurs. Abdomen: Soft, nontender, nondistended. Extremities: Trace ankle edema bilaterally.  ASSESSMENT AND PLAN: .    Permanent atrial fibrillation: Ventricular rates well-controlled off AV nodal blocking agents.  Defer adding beta-blocker or calcium channel blocker at this time.  Continue indefinite anticoagulation with apixaban 5 mg twice daily in the setting of a CHA2DS2-VASc score of 4.  Hypertension: Blood pressure mildly elevated today.  Amlodipine was discontinued after our last visit by PCP.  We have agreed to increase losartan to 75 mg daily.  I will have Mr. Janee Morn  return for a BMP to ensure stable renal function and electrolytes in about 2 weeks.  Coronary artery disease and hyperlipidemia: No angina reported.  Most recent lipid panel in 01/2023 was suboptimal with LDL of 81.  We discussed escalation of atorvastatin, but Mr. Tzintzun wishes to stay with his 20 mg daily dose and work on lifestyle modifications.  Lipids should be repeated when he follows up with Dr. Larwance Sachs in March and escalation of atorvastatin  readdressed if LDL remains above 70.  Continue apixaban and lieu of aspirin.    Dispo: Return to clinic in 6 months.  Signed, Yvonne Kendall, MD

## 2023-04-21 ENCOUNTER — Ambulatory Visit: Payer: Managed Care, Other (non HMO) | Attending: Internal Medicine | Admitting: Internal Medicine

## 2023-04-21 ENCOUNTER — Encounter: Payer: Self-pay | Admitting: Internal Medicine

## 2023-04-21 VITALS — BP 144/88 | HR 65 | Ht 73.5 in | Wt 240.6 lb

## 2023-04-21 DIAGNOSIS — E785 Hyperlipidemia, unspecified: Secondary | ICD-10-CM | POA: Diagnosis not present

## 2023-04-21 DIAGNOSIS — I2510 Atherosclerotic heart disease of native coronary artery without angina pectoris: Secondary | ICD-10-CM | POA: Diagnosis not present

## 2023-04-21 DIAGNOSIS — I4821 Permanent atrial fibrillation: Secondary | ICD-10-CM | POA: Diagnosis not present

## 2023-04-21 DIAGNOSIS — I10 Essential (primary) hypertension: Secondary | ICD-10-CM

## 2023-04-21 DIAGNOSIS — Z79899 Other long term (current) drug therapy: Secondary | ICD-10-CM

## 2023-04-21 MED ORDER — LOSARTAN POTASSIUM 50 MG PO TABS: 75.0000 mg | ORAL_TABLET | Freq: Every day | ORAL | 3 refills | Status: AC

## 2023-04-21 NOTE — Patient Instructions (Signed)
Medication Instructions:  Your physician recommends the following medication changes.  INCREASE: Losartan to 75 mg by mouth daily   *If you need a refill on your cardiac medications before your next appointment, please call your pharmacy*   Lab Work: Your provider would like for you to return in 2 weeks to have the following labs drawn: (BMP).   Please go to [facility] [address] ([facility] [address], [place] [address] You do not need an appointment.  They are open from 8 am- 4:30 pm.  Lunch from 1:00 pm- 2:00 pm You will not need to be fasting.   You may also go to one of the following [facility]:  [address] [place], [place] [address] Phone: [phone] Lab hours: Mon-Fri 8 am- 5 pm    Lunch 12 pm- 1 pm  [address] [place],  [place]  [address]  [place] Phone: [phone] Lab hours: 7 am- 4 pm Lunch 12 pm-1 pm   [address] [place],  [place]  [address]  [place] Phone: [phone] Lab hours: Mon-Fri 8 am- 5 pm    Lunch 12 pm- 1 pm    Testing/Procedures: No test ordered today    Follow-Up: At [facility], you and your health needs are our priority.  As part of our continuing mission to provide you with exceptional heart care, we have created designated Provider Care Teams.  These Care Teams include your primary Cardiologist (physician) and Advanced Practice Providers (APPs -  Physician Assistants and Nurse Practitioners) who all work together to provide you with the care you need, when you need it.  We recommend signing up for the patient portal called "MyChart".  Sign up information is provided on this After Visit Summary.  MyChart is used to connect with patients for Virtual Visits (Telemedicine).  Patients are able to view lab/test results, encounter notes, upcoming appointments, etc.  Non-urgent messages can be sent to your provider as well.   To learn more about what you can do with MyChart, go to [email or web address].    Your  next appointment:   6 month(s)  Provider:   You may see Yvonne Kendall, MD or one of the following Advanced Practice Providers on your designated Care Team:   Nicolasa Ducking, NP Eula Listen, PA-C Cadence Fransico Michael, PA-C Charlsie Quest, NP Carlos Levering, NP

## 2023-04-28 DIAGNOSIS — D0439 Carcinoma in situ of skin of other parts of face: Secondary | ICD-10-CM | POA: Diagnosis not present

## 2023-04-28 DIAGNOSIS — C44329 Squamous cell carcinoma of skin of other parts of face: Secondary | ICD-10-CM | POA: Diagnosis not present

## 2023-05-05 DIAGNOSIS — C44329 Squamous cell carcinoma of skin of other parts of face: Secondary | ICD-10-CM | POA: Diagnosis not present

## 2023-05-05 DIAGNOSIS — L814 Other melanin hyperpigmentation: Secondary | ICD-10-CM | POA: Diagnosis not present

## 2023-05-05 DIAGNOSIS — L578 Other skin changes due to chronic exposure to nonionizing radiation: Secondary | ICD-10-CM | POA: Diagnosis not present

## 2023-05-05 DIAGNOSIS — C44319 Basal cell carcinoma of skin of other parts of face: Secondary | ICD-10-CM | POA: Diagnosis not present

## 2023-05-12 DIAGNOSIS — C44722 Squamous cell carcinoma of skin of right lower limb, including hip: Secondary | ICD-10-CM | POA: Diagnosis not present

## 2023-05-16 ENCOUNTER — Telehealth: Payer: Self-pay | Admitting: Internal Medicine

## 2023-05-16 DIAGNOSIS — E782 Mixed hyperlipidemia: Secondary | ICD-10-CM

## 2023-05-16 DIAGNOSIS — Z79899 Other long term (current) drug therapy: Secondary | ICD-10-CM | POA: Diagnosis not present

## 2023-05-16 MED ORDER — ATORVASTATIN CALCIUM 20 MG PO TABS: 20.0000 mg | ORAL_TABLET | Freq: Every day | ORAL | 1 refills | Status: DC

## 2023-05-16 NOTE — Telephone Encounter (Signed)
*  STAT* If patient is at the pharmacy, call can be transferred to refill team.   1. Which medications need to be refilled? (please list name of each medication and dose if known)  atorvastatin (LIPITOR) 20 MG tablet 2. Which pharmacy/location (including street and city if local pharmacy) is medication to be sent to? [facility] - [place], [place] - [address]  3. Do they need a 30 day or 90 day supply?   90 day supply  Please remove note saying an appointment is needed. Pharmacy will not distribute medication.

## 2023-05-17 LAB — BASIC METABOLIC PANEL
BUN/Creatinine Ratio: 13 (ref 10–24)
BUN: 20 mg/dL (ref 8–27)
CO2: 24 mmol/L (ref 20–29)
Calcium: 9.4 mg/dL (ref 8.6–10.2)
Chloride: 104 mmol/L (ref 96–106)
Creatinine, Ser: 1.55 mg/dL — ABNORMAL HIGH (ref 0.76–1.27)
Glucose: 80 mg/dL (ref 70–99)
Potassium: 4.9 mmol/L (ref 3.5–5.2)
Sodium: 143 mmol/L (ref 134–144)
eGFR: 45 mL/min/{1.73_m2} — ABNORMAL LOW (ref >59)

## 2023-05-19 ENCOUNTER — Telehealth: Payer: Self-pay | Admitting: Internal Medicine

## 2023-05-19 NOTE — Telephone Encounter (Signed)
The patient has been notified of the result along with recommendations. Pt verbalized understanding. All questions (if any) were answered     End, Cristal Deer, MD to Me      05/18/23 12:43 PM Result Note Please let Mr. Springborn know that his kidney function and electrolytes are stable.  I recommend that he continue his current medications and follow-up as previously arranged

## 2023-05-19 NOTE — Telephone Encounter (Signed)
Patient is returning a call in regard to lab results. Please advise.

## 2023-05-26 DIAGNOSIS — C44319 Basal cell carcinoma of skin of other parts of face: Secondary | ICD-10-CM | POA: Diagnosis not present

## 2023-05-26 DIAGNOSIS — L905 Scar conditions and fibrosis of skin: Secondary | ICD-10-CM | POA: Diagnosis not present

## 2023-08-09 DIAGNOSIS — E7800 Pure hypercholesterolemia, unspecified: Secondary | ICD-10-CM | POA: Diagnosis not present

## 2023-08-09 DIAGNOSIS — N1831 Chronic kidney disease, stage 3a: Secondary | ICD-10-CM | POA: Diagnosis not present

## 2023-08-09 DIAGNOSIS — R7303 Prediabetes: Secondary | ICD-10-CM | POA: Diagnosis not present

## 2023-08-16 DIAGNOSIS — Z125 Encounter for screening for malignant neoplasm of prostate: Secondary | ICD-10-CM | POA: Diagnosis not present

## 2023-08-16 DIAGNOSIS — N1832 Chronic kidney disease, stage 3b: Secondary | ICD-10-CM | POA: Diagnosis not present

## 2023-08-16 DIAGNOSIS — I10 Essential (primary) hypertension: Secondary | ICD-10-CM | POA: Diagnosis not present

## 2023-08-16 DIAGNOSIS — I25118 Atherosclerotic heart disease of native coronary artery with other forms of angina pectoris: Secondary | ICD-10-CM | POA: Diagnosis not present

## 2023-08-16 DIAGNOSIS — R7303 Prediabetes: Secondary | ICD-10-CM | POA: Diagnosis not present

## 2023-08-16 DIAGNOSIS — Z79899 Other long term (current) drug therapy: Secondary | ICD-10-CM | POA: Diagnosis not present

## 2023-08-16 DIAGNOSIS — I480 Paroxysmal atrial fibrillation: Secondary | ICD-10-CM | POA: Diagnosis not present

## 2023-08-16 DIAGNOSIS — E7800 Pure hypercholesterolemia, unspecified: Secondary | ICD-10-CM | POA: Diagnosis not present

## 2023-08-23 DIAGNOSIS — M1712 Unilateral primary osteoarthritis, left knee: Secondary | ICD-10-CM | POA: Diagnosis not present

## 2023-09-02 DIAGNOSIS — M7052 Other bursitis of knee, left knee: Secondary | ICD-10-CM | POA: Diagnosis not present

## 2023-09-02 DIAGNOSIS — M1712 Unilateral primary osteoarthritis, left knee: Secondary | ICD-10-CM | POA: Diagnosis not present

## 2023-09-06 DIAGNOSIS — M79675 Pain in left toe(s): Secondary | ICD-10-CM | POA: Diagnosis not present

## 2023-09-06 DIAGNOSIS — M7989 Other specified soft tissue disorders: Secondary | ICD-10-CM | POA: Diagnosis not present

## 2023-09-08 DIAGNOSIS — N401 Enlarged prostate with lower urinary tract symptoms: Secondary | ICD-10-CM | POA: Diagnosis not present

## 2023-09-08 DIAGNOSIS — R351 Nocturia: Secondary | ICD-10-CM | POA: Diagnosis not present

## 2023-09-08 DIAGNOSIS — N1832 Chronic kidney disease, stage 3b: Secondary | ICD-10-CM | POA: Diagnosis not present

## 2023-09-08 DIAGNOSIS — M4690 Unspecified inflammatory spondylopathy, site unspecified: Secondary | ICD-10-CM | POA: Diagnosis not present

## 2023-09-15 DIAGNOSIS — N179 Acute kidney failure, unspecified: Secondary | ICD-10-CM | POA: Diagnosis not present

## 2023-09-15 DIAGNOSIS — R7989 Other specified abnormal findings of blood chemistry: Secondary | ICD-10-CM | POA: Diagnosis not present

## 2023-09-15 DIAGNOSIS — D72829 Elevated white blood cell count, unspecified: Secondary | ICD-10-CM | POA: Diagnosis not present

## 2023-09-16 DIAGNOSIS — N179 Acute kidney failure, unspecified: Secondary | ICD-10-CM | POA: Diagnosis not present

## 2023-09-16 DIAGNOSIS — R748 Abnormal levels of other serum enzymes: Secondary | ICD-10-CM | POA: Diagnosis not present

## 2023-09-16 DIAGNOSIS — M4690 Unspecified inflammatory spondylopathy, site unspecified: Secondary | ICD-10-CM | POA: Diagnosis not present

## 2023-10-18 ENCOUNTER — Ambulatory Visit: Attending: Medical | Admitting: Medical

## 2023-10-18 ENCOUNTER — Encounter: Payer: Self-pay | Admitting: Medical

## 2023-10-18 VITALS — BP 160/85 | HR 60 | Ht 73.0 in | Wt 230.8 lb

## 2023-10-18 DIAGNOSIS — I10 Essential (primary) hypertension: Secondary | ICD-10-CM | POA: Diagnosis not present

## 2023-10-18 DIAGNOSIS — I4821 Permanent atrial fibrillation: Secondary | ICD-10-CM | POA: Diagnosis not present

## 2023-10-18 DIAGNOSIS — E782 Mixed hyperlipidemia: Secondary | ICD-10-CM

## 2023-10-18 DIAGNOSIS — I2510 Atherosclerotic heart disease of native coronary artery without angina pectoris: Secondary | ICD-10-CM

## 2023-10-18 NOTE — Patient Instructions (Signed)
 Medication Instructions:  Your physician recommends that you continue on your current medications as directed. Please refer to the Current Medication list given to you today.   *If you need a refill on your cardiac medications before your next appointment, please call your pharmacy*  Lab Work: No labs ordered today  If you have labs (blood work) drawn today and your tests are completely normal, you will receive your results only by: MyChart Message (if you have MyChart) OR A paper copy in the mail If you have any lab test that is abnormal or we need to change your treatment, we will call you to review the results.  Testing/Procedures: No test ordered today   Follow-Up: At [facility], you and your health needs are our priority.  As part of our continuing mission to provide you with exceptional heart care, our providers are all part of one team.  This team includes your primary Cardiologist (physician) and Advanced Practice Providers or APPs (Physician Assistants and Nurse Practitioners) who all work together to provide you with the care you need, when you need it.  Your next appointment:   6 month(s)  Provider:   Lonni Hanson, MD or Cadence Franchester, [place]    We recommend signing up for the patient portal called MyChart.  Sign up information is provided on this After Visit Summary.  MyChart is used to connect with patients for Virtual Visits (Telemedicine).  Patients are able to view lab/test results, encounter notes, upcoming appointments, etc.  Non-urgent messages can be sent to your provider as well.   To learn more about what you can do with MyChart, go to [email or web address].

## 2023-10-18 NOTE — Progress Notes (Signed)
 Cardiology Office Note   Date:  10/18/2023  ID:  Talal, Fritchman Jun 05, 1945, MRN [id number] PCP: Diedra Lame, MD  [facility] Providers Cardiologist:  Lonni Hanson, MD    History of Present Illness Joseph Roberson is a 78 y.o. male with a history of permanent atrial fibrillation, coronary artery disease with non-STEMI managed medically in 08/2019, hypertension, hyperlipidemia, and chronic kidney disease stage III who returns for follow-up of CAD and A-fib.  Patient was last seen 04/21/2023 and was overall feeling well from a cardiac perspective.  Today, the patient reports COVID-like illness 6 weeks ago and is still recovering from this. Energy is getting better each week. He denies chest pain or SOB. He has dependent edema that is stable. He denies heart fluttering or palpitations. He was having issues with his toe that PCP treated with antibiotics and steroids. He will continue to follow-up with PCP for this.  Studies Reviewed EKG Interpretation Date/Time:  Tuesday October 18 2023 07:55:53 EDT Ventricular Rate:  60 PR Interval:    QRS Duration:  114 QT Interval:  432 QTC Calculation: 432 R Axis:   -9  Text Interpretation: Atrial fibrillation When compared with ECG of 21-Apr-2023 08:02, No significant change was found Confirmed by Franchester, Tamee Battin ([id number]) on 10/18/2023 8:05:38 AM    LHC 2021 Dist LAD lesion is 70% stenosed. Mid LAD lesion is 30% stenosed. Ost LM lesion is 20% stenosed. Prox RCA lesion is 30% stenosed. Mid RCA lesion is 30% stenosed. Dist RCA lesion is 30% stenosed. Ramus lesion is 99% stenosed.   1.  Subtotal occlusion involving a terminal branch of ramus intermedius is the likely culprit for small non-STEMI.  This is likely due to embolization from atrial fibrillation.  There is 70% stenosis in the distal LAD close to the apex and no evidence of other obstructive disease. 2.  Left ventricular angiography was not performed due to chronic kidney  disease.  EF was normal by echo.  High normal LVEDP at 12 to 13 mmHg.   Recommendations: Recommend medical therapy and anticoagulation. Resume heparin  6 hours after sheath pull.  Start Eliquis  tomorrow morning as long as no bleeding issues. I am going to add small dose atorvastatin  to see if he can tolerate this.   Echo 2021  1. Left ventricular ejection fraction, by estimation, is 55 to 60%. The  left ventricle has normal function. The left ventricle has no regional  wall motion abnormalities. There is mild left ventricular hypertrophy.  Left ventricular diastolic parameters  are indeterminate.   2. Right ventricular systolic function is normal. The right ventricular  size is normal. Tricuspid regurgitation signal is inadequate for assessing  PA pressure.   3. Left atrial size was moderately dilated.   4. Right atrial size was moderately dilated.   5. The mitral valve is normal in structure. No evidence of mitral valve  regurgitation. No evidence of mitral stenosis.   6. The aortic valve is normal in structure. Aortic valve regurgitation is  not visualized. No aortic stenosis is present.   Physical Exam VS:  BP (!) 160/85   Pulse 60   Ht 6' 1 (1.854 m)   Wt 230 lb 12.8 oz (104.7 kg)   SpO2 97%   BMI 30.45 kg/m        Wt Readings from Last 3 Encounters:  10/18/23 230 lb 12.8 oz (104.7 kg)  04/21/23 240 lb 9.6 oz (109.1 kg)  07/21/22 244 lb (110.7 kg)    GEN:  Well nourished, well developed in no acute distress NECK: No JVD; No carotid bruits CARDIAC: Irreg Irreg, no murmurs, rubs, gallops RESPIRATORY:  Clear to auscultation without rales, wheezing or rhonchi  ABDOMEN: Soft, non-tender, non-distended EXTREMITIES:  trace lower leg edema; No deformity   ASSESSMENT AND PLAN  Permanent Afib Patient is in rate controlled Afib today. Continue Eliquis  5mg  BID. He denies bleeding issues. He is not on AV nodal blocking agents due to borderline heart rate.   HTN BP is mildly  elevated today, but this is abnormal for him. We will continue to monitor. Continue Losartan  50mg  daily. I recommended he check BP and call in if it remains high, may need to increase Losartan  to 75mg  daily.   CAD He denies anginal symptoms. He stays active. Continue Lipitor. No ASA given Eliquis .   HLD LDL 66. Continue Lipitor 20mg  daily.        Dispo: Follow-up in 6 months  Signed, Arihanna Estabrook VEAR Fishman, PA-C

## 2023-10-26 DIAGNOSIS — Z85820 Personal history of malignant melanoma of skin: Secondary | ICD-10-CM | POA: Diagnosis not present

## 2023-10-26 DIAGNOSIS — L821 Other seborrheic keratosis: Secondary | ICD-10-CM | POA: Diagnosis not present

## 2023-10-26 DIAGNOSIS — Z85828 Personal history of other malignant neoplasm of skin: Secondary | ICD-10-CM | POA: Diagnosis not present

## 2023-10-26 DIAGNOSIS — D2261 Melanocytic nevi of right upper limb, including shoulder: Secondary | ICD-10-CM | POA: Diagnosis not present

## 2023-10-26 DIAGNOSIS — D225 Melanocytic nevi of trunk: Secondary | ICD-10-CM | POA: Diagnosis not present

## 2023-10-26 DIAGNOSIS — Z86006 Personal history of melanoma in-situ: Secondary | ICD-10-CM | POA: Diagnosis not present

## 2023-10-26 DIAGNOSIS — D2262 Melanocytic nevi of left upper limb, including shoulder: Secondary | ICD-10-CM | POA: Diagnosis not present

## 2023-10-26 DIAGNOSIS — L570 Actinic keratosis: Secondary | ICD-10-CM | POA: Diagnosis not present

## 2023-11-06 ENCOUNTER — Other Ambulatory Visit: Payer: Self-pay | Admitting: Internal Medicine

## 2023-11-06 DIAGNOSIS — E782 Mixed hyperlipidemia: Secondary | ICD-10-CM

## 2023-11-10 DIAGNOSIS — D0461 Carcinoma in situ of skin of right upper limb, including shoulder: Secondary | ICD-10-CM | POA: Diagnosis not present

## 2023-11-10 DIAGNOSIS — D0462 Carcinoma in situ of skin of left upper limb, including shoulder: Secondary | ICD-10-CM | POA: Diagnosis not present

## 2023-11-10 DIAGNOSIS — D485 Neoplasm of uncertain behavior of skin: Secondary | ICD-10-CM | POA: Diagnosis not present

## 2023-11-10 DIAGNOSIS — L570 Actinic keratosis: Secondary | ICD-10-CM | POA: Diagnosis not present

## 2023-11-10 DIAGNOSIS — C44612 Basal cell carcinoma of skin of right upper limb, including shoulder: Secondary | ICD-10-CM | POA: Diagnosis not present

## 2023-12-07 DIAGNOSIS — D0462 Carcinoma in situ of skin of left upper limb, including shoulder: Secondary | ICD-10-CM | POA: Diagnosis not present

## 2023-12-07 DIAGNOSIS — C44612 Basal cell carcinoma of skin of right upper limb, including shoulder: Secondary | ICD-10-CM | POA: Diagnosis not present

## 2023-12-07 DIAGNOSIS — D0461 Carcinoma in situ of skin of right upper limb, including shoulder: Secondary | ICD-10-CM | POA: Diagnosis not present

## 2024-02-02 DIAGNOSIS — N1832 Chronic kidney disease, stage 3b: Secondary | ICD-10-CM | POA: Diagnosis not present

## 2024-02-02 DIAGNOSIS — R7303 Prediabetes: Secondary | ICD-10-CM | POA: Diagnosis not present

## 2024-02-02 DIAGNOSIS — Z79899 Other long term (current) drug therapy: Secondary | ICD-10-CM | POA: Diagnosis not present

## 2024-03-05 DIAGNOSIS — H52223 Regular astigmatism, bilateral: Secondary | ICD-10-CM | POA: Diagnosis not present

## 2024-03-05 DIAGNOSIS — H40023 Open angle with borderline findings, high risk, bilateral: Secondary | ICD-10-CM | POA: Diagnosis not present

## 2024-03-05 DIAGNOSIS — H524 Presbyopia: Secondary | ICD-10-CM | POA: Diagnosis not present

## 2024-03-05 DIAGNOSIS — H5213 Myopia, bilateral: Secondary | ICD-10-CM | POA: Diagnosis not present

## 2024-03-05 DIAGNOSIS — H2513 Age-related nuclear cataract, bilateral: Secondary | ICD-10-CM | POA: Diagnosis not present

## 2024-03-17 ENCOUNTER — Other Ambulatory Visit: Payer: Self-pay | Admitting: Internal Medicine

## 2024-03-17 DIAGNOSIS — I4819 Other persistent atrial fibrillation: Secondary | ICD-10-CM

## 2024-03-19 NOTE — Telephone Encounter (Signed)
 Prescription refill request for Eliquis  received. Indication: A-Fib Last office visit: 10/18/23 Scr: 1.4 09/15/23 Care Everywhere Age: 78 Weight: 104.7 KG  Pt has Passed all Requirements. Doseage Correct
